# Patient Record
Sex: Female | Born: 1970 | Hispanic: Yes | Marital: Married | Smoking: Former smoker
Health system: Southern US, Community
[De-identification: ages and names within clinical notes are randomized; demographics above are authoritative.]

## PROBLEM LIST (undated history)

## (undated) DIAGNOSIS — E785 Hyperlipidemia, unspecified: Secondary | ICD-10-CM

## (undated) HISTORY — PX: TUBAL LIGATION: SHX77

## (undated) HISTORY — PX: APPENDECTOMY: SHX54

## (undated) HISTORY — DX: Hyperlipidemia, unspecified: E78.5

---

## 2019-01-29 ENCOUNTER — Ambulatory Visit (INDEPENDENT_AMBULATORY_CARE_PROVIDER_SITE_OTHER): Payer: BLUE CROSS/BLUE SHIELD | Admitting: Family Medicine

## 2019-01-29 ENCOUNTER — Encounter: Payer: Self-pay | Admitting: Family Medicine

## 2019-01-29 VITALS — BP 138/70 | HR 60 | Temp 98.7°F | Resp 16 | Ht 61.5 in | Wt 137.0 lb

## 2019-01-29 DIAGNOSIS — Z124 Encounter for screening for malignant neoplasm of cervix: Secondary | ICD-10-CM

## 2019-01-29 DIAGNOSIS — Z1239 Encounter for other screening for malignant neoplasm of breast: Secondary | ICD-10-CM | POA: Diagnosis not present

## 2019-01-29 DIAGNOSIS — Z0001 Encounter for general adult medical examination with abnormal findings: Secondary | ICD-10-CM | POA: Diagnosis not present

## 2019-01-29 DIAGNOSIS — R319 Hematuria, unspecified: Secondary | ICD-10-CM

## 2019-01-29 DIAGNOSIS — Z01419 Encounter for gynecological examination (general) (routine) without abnormal findings: Secondary | ICD-10-CM | POA: Diagnosis not present

## 2019-01-29 DIAGNOSIS — Z131 Encounter for screening for diabetes mellitus: Secondary | ICD-10-CM

## 2019-01-29 DIAGNOSIS — F17200 Nicotine dependence, unspecified, uncomplicated: Secondary | ICD-10-CM

## 2019-01-29 DIAGNOSIS — R300 Dysuria: Secondary | ICD-10-CM

## 2019-01-29 DIAGNOSIS — Z130 Encounter for screening for diseases of the blood and blood-forming organs and certain disorders involving the immune mechanism: Secondary | ICD-10-CM | POA: Diagnosis not present

## 2019-01-29 DIAGNOSIS — R87610 Atypical squamous cells of undetermined significance on cytologic smear of cervix (ASC-US): Secondary | ICD-10-CM

## 2019-01-29 DIAGNOSIS — E010 Iodine-deficiency related diffuse (endemic) goiter: Secondary | ICD-10-CM

## 2019-01-29 DIAGNOSIS — E663 Overweight: Secondary | ICD-10-CM

## 2019-01-29 DIAGNOSIS — Z114 Encounter for screening for human immunodeficiency virus [HIV]: Secondary | ICD-10-CM

## 2019-01-29 DIAGNOSIS — Z7689 Persons encountering health services in other specified circumstances: Secondary | ICD-10-CM

## 2019-01-29 DIAGNOSIS — Z13220 Encounter for screening for lipoid disorders: Secondary | ICD-10-CM

## 2019-01-29 LAB — URINALYSIS, ROUTINE W REFLEX MICROSCOPIC
BILIRUBIN URINE: NEGATIVE
Bacteria, UA: NONE SEEN /HPF
Glucose, UA: NEGATIVE
Ketones, ur: NEGATIVE
Leukocytes,Ua: NEGATIVE
NITRITE: NEGATIVE
Protein, ur: NEGATIVE
SPECIFIC GRAVITY, URINE: 1.025 (ref 1.001–1.03)
WBC, UA: NONE SEEN /HPF (ref 0–5)
pH: 7 (ref 5.0–8.0)

## 2019-01-29 LAB — MICROSCOPIC MESSAGE

## 2019-01-29 NOTE — Progress Notes (Deleted)
Patient ID: Whitney Torres, female    DOB: Dec 06, 1970, 48 y.o.   MRN: [id number]  PCP: Danelle Berry, PA-C  Chief Complaint  Patient presents with  . Annual Exam    Needs pap smear    Subjective:   Whitney Torres is a 48 y.o. female, presents to clinic for annual exam/well woman, is due for PAP and is new to establish care.  About 1 year ago she moved to the [place] from [place], where she lives with her husband.  She has many family members here in [place].    She is G2P2, still having periods monthly, the last 5 days long, have become slightly more bothersome over the past few years, but she does not have heavy blood flow or severe cramps.  LMP was 5 days ago.   She has 2 children, her older child is 34 she had difficulty with his pregnancy with preeclampsia, pregnancy-induced hypertension, she had a C-section 1999, her second child had an uneventful pregnancy and had a repeat C-section in 2012, she is also had appendectomy and tubal ligation. She is sexually active with her husband, no history of STDs.  She reports some urinary frequency intermittent pain or urgency.  She denies any abdominal pain, flank pain, hematuria.  She believes her last Pap was 5 to 6 years ago?  Would like to do today, although she is visibly upset appearing when we discuss it.    She reports a past medical history of elevated cholesterol, but she denies being on any medications for this.  She is a current smoker only smokes a few cigarettes a day and has done this for about 10 years, she has no desire to quit.  Family hx pertinent for  All other medical, family, social history updated per chart   There are no active problems to display for this patient.   Current Meds  Medication Sig  . aspirin EC 81 MG tablet Take 81 mg by mouth at bedtime.     Review of Systems     Objective:    Vitals:   01/29/19 1452  BP: 138/70  Pulse: 60  Resp: 16  Temp: 98.7 F (37.1  C)  TempSrc: Oral  SpO2: 98%  Weight: 137 lb (62.1 kg)  Height: 5' 1.5" (1.562 m)      Physical Exam        Assessment & Plan:   Problem List Items Addressed This Visit    None    Visit Diagnoses    Dysuria    -  Primary   Relevant Orders   Urinalysis, Routine w reflex microscopic (Completed)   Encounter for cervical Pap smear with pelvic exam       Relevant Orders   CBC with Differential/Platelet   COMPLETE METABOLIC PANEL WITH GFR   Lipid panel   TSH   Hemoglobin A1c   PAP,TP IMGw/HPV RNA,rflx HPVTYPE16,18/45 (Completed)   Urinalysis, Routine w reflex microscopic (Completed)   Screening for malignant neoplasm of bladder       Relevant Orders   CBC with Differential/Platelet   COMPLETE METABOLIC PANEL WITH GFR   Lipid panel   TSH   Hemoglobin A1c   PAP,TP IMGw/HPV RNA,rflx HPVTYPE16,18/45 (Completed)   Urinalysis, Routine w reflex microscopic (Completed)   Screening for malignant neoplasm of breast       Relevant Orders   CBC with Differential/Platelet   COMPLETE METABOLIC PANEL WITH GFR   Lipid panel   TSH  Hemoglobin A1c   PAP,TP IMGw/HPV RNA,rflx HPVTYPE16,18/45 (Completed)   Urinalysis, Routine w reflex microscopic (Completed)   Encounter for general adult medical examination with abnormal findings       Visit for screening mammogram       Screening for deficiency anemia       Screening for lipoid disorders       Well woman exam with routine gynecological exam       Cervical cancer screening       Overweight (BMI 25.0-29.9)       Relevant Orders   COMPLETE METABOLIC PANEL WITH GFR   Lipid panel   TSH   Hemoglobin A1c   Hematuria, unspecified type              Danelle Berry, PA-C 01/31/19 5:38 PM

## 2019-01-29 NOTE — Patient Instructions (Signed)
Health Maintenance, Female Adopting a healthy lifestyle and getting preventive care can go a long way to promote health and wellness. Talk with your health care provider about what schedule of regular examinations is right for you. This is a good chance for you to check in with your provider about disease prevention and staying healthy. In between checkups, there are plenty of things you can do on your own. Experts have done a lot of research about which lifestyle changes and preventive measures are most likely to keep you healthy. Ask your health care provider for more information. Weight and diet Eat a healthy diet  Be sure to include plenty of vegetables, fruits, low-fat dairy products, and lean protein.  Do not eat a lot of foods high in solid fats, added sugars, or salt.  Get regular exercise. This is one of the most important things you can do for your health. ? Most adults should exercise for at least 150 minutes each week. The exercise should increase your heart rate and make you sweat (moderate-intensity exercise). ? Most adults should also do strengthening exercises at least twice a week. This is in addition to the moderate-intensity exercise. Maintain a healthy weight  Body mass index (BMI) is a measurement that can be used to identify possible weight problems. It estimates body fat based on height and weight. Your health care provider can help determine your BMI and help you achieve or maintain a healthy weight.  For females 5 years of age and older: ? A BMI below 18.5 is considered underweight. ? A BMI of 18.5 to 24.9 is normal. ? A BMI of 25 to 29.9 is considered overweight. ? A BMI of 30 and above is considered obese. Watch levels of cholesterol and blood lipids  You should start having your blood tested for lipids and cholesterol at 47 years of age, then have this test every 5 years.  You may need to have your cholesterol levels checked more often if: ? Your lipid or  cholesterol levels are high. ? You are older than 47 years of age. ? You are at high risk for heart disease. Cancer screening Lung Cancer  Lung cancer screening is recommended for adults 48-79 years old who are at high risk for lung cancer because of a history of smoking.  A yearly low-dose CT scan of the lungs is recommended for people who: ? Currently smoke. ? Have quit within the past 15 years. ? Have at least a 30-pack-year history of smoking. A pack year is smoking an average of one pack of cigarettes a day for 1 year.  Yearly screening should continue until it has been 15 years since you quit.  Yearly screening should stop if you develop a health problem that would prevent you from having lung cancer treatment. Breast Cancer  Practice breast self-awareness. This means understanding how your breasts normally appear and feel.  It also means doing regular breast self-exams. Let your health care provider know about any changes, no matter how small.  If you are in your 20s or 30s, you should have a clinical breast exam (CBE) by a health care provider every 1-3 years as part of a regular health exam.  If you are 22 or older, have a CBE every year. Also consider having a breast X-ray (mammogram) every year.  If you have a family history of breast cancer, talk to your health care provider about genetic screening.  If you are at high risk for breast cancer, talk  to your health care provider about having an MRI and a mammogram every year.  Breast cancer gene (BRCA) assessment is recommended for women who have family members with BRCA-related cancers. BRCA-related cancers include: ? Breast. ? Ovarian. ? Tubal. ? Peritoneal cancers.  Results of the assessment will determine the need for genetic counseling and BRCA1 and BRCA2 testing. Cervical Cancer Your health care provider may recommend that you be screened regularly for cancer of the pelvic organs (ovaries, uterus, and vagina).  This screening involves a pelvic examination, including checking for microscopic changes to the surface of your cervix (Pap test). You may be encouraged to have this screening done every 3 years, beginning at age 21.  For women ages 30-65, health care providers may recommend pelvic exams and Pap testing every 3 years, or they may recommend the Pap and pelvic exam, combined with testing for human papilloma virus (HPV), every 5 years. Some types of HPV increase your risk of cervical cancer. Testing for HPV may also be done on women of any age with unclear Pap test results.  Other health care providers may not recommend any screening for nonpregnant women who are considered low risk for pelvic cancer and who do not have symptoms. Ask your health care provider if a screening pelvic exam is right for you.  If you have had past treatment for cervical cancer or a condition that could lead to cancer, you need Pap tests and screening for cancer for at least 20 years after your treatment. If Pap tests have been discontinued, your risk factors (such as having a new sexual partner) need to be reassessed to determine if screening should resume. Some women have medical problems that increase the chance of getting cervical cancer. In these cases, your health care provider may recommend more frequent screening and Pap tests. Colorectal Cancer  This type of cancer can be detected and often prevented.  Routine colorectal cancer screening usually begins at 47 years of age and continues through 47 years of age.  Your health care provider may recommend screening at an earlier age if you have risk factors for colon cancer.  Your health care provider may also recommend using home test kits to check for hidden blood in the stool.  A small camera at the end of a tube can be used to examine your colon directly (sigmoidoscopy or colonoscopy). This is done to check for the earliest forms of colorectal cancer.  Routine  screening usually begins at age 50.  Direct examination of the colon should be repeated every 5-10 years through 47 years of age. However, you may need to be screened more often if early forms of precancerous polyps or small growths are found. Skin Cancer  Check your skin from head to toe regularly.  Tell your health care provider about any new moles or changes in moles, especially if there is a change in a mole's shape or color.  Also tell your health care provider if you have a mole that is larger than the size of a pencil eraser.  Always use sunscreen. Apply sunscreen liberally and repeatedly throughout the day.  Protect yourself by wearing long sleeves, pants, a wide-brimmed hat, and sunglasses whenever you are outside. Heart disease, diabetes, and high blood pressure  High blood pressure causes heart disease and increases the risk of stroke. High blood pressure is more likely to develop in: ? People who have blood pressure in the high end of the normal range (130-139/85-89 mm Hg). ? People   who are overweight or obese. ? People who are African American.  If you are 84-22 years of age, have your blood pressure checked every 3-5 years. If you are 67 years of age or older, have your blood pressure checked every year. You should have your blood pressure measured twice-once when you are at a hospital or clinic, and once when you are not at a hospital or clinic. Record the average of the two measurements. To check your blood pressure when you are not at a hospital or clinic, you can use: ? An automated blood pressure machine at a pharmacy. ? A home blood pressure monitor.  If you are between 52 years and 3 years old, ask your health care provider if you should take aspirin to prevent strokes.  Have regular diabetes screenings. This involves taking a blood sample to check your fasting blood sugar level. ? If you are at a normal weight and have a low risk for diabetes, have this test once  every three years after 47 years of age. ? If you are overweight and have a high risk for diabetes, consider being tested at a younger age or more often. Preventing infection Hepatitis B  If you have a higher risk for hepatitis B, you should be screened for this virus. You are considered at high risk for hepatitis B if: ? You were born in a country where hepatitis B is common. Ask your health care provider which countries are considered high risk. ? Your parents were born in a high-risk country, and you have not been immunized against hepatitis B (hepatitis B vaccine). ? You have HIV or AIDS. ? You use needles to inject street drugs. ? You live with someone who has hepatitis B. ? You have had sex with someone who has hepatitis B. ? You get hemodialysis treatment. ? You take certain medicines for conditions, including cancer, organ transplantation, and autoimmune conditions. Hepatitis C  Blood testing is recommended for: ? Everyone born from [birth year] through 1964. ? Anyone with known risk factors for hepatitis C. Sexually transmitted infections (STIs)  You should be screened for sexually transmitted infections (STIs) including gonorrhea and chlamydia if: ? You are sexually active and are younger than 47 years of age. ? You are older than 47 years of age and your health care provider tells you that you are at risk for this type of infection. ? Your sexual activity has changed since you were last screened and you are at an increased risk for chlamydia or gonorrhea. Ask your health care provider if you are at risk.  If you do not have HIV, but are at risk, it may be recommended that you take a prescription medicine daily to prevent HIV infection. This is called pre-exposure prophylaxis (PrEP). You are considered at risk if: ? You are sexually active and do not regularly use condoms or know the HIV status of your partner(s). ? You take drugs by injection. ? You are sexually active with a partner  who has HIV. Talk with your health care provider about whether you are at high risk of being infected with HIV. If you choose to begin PrEP, you should first be tested for HIV. You should then be tested every 3 months for as long as you are taking PrEP. Pregnancy  If you are premenopausal and you may become pregnant, ask your health care provider about preconception counseling.  If you may become pregnant, take 400 to 800 micrograms (mcg) of folic acid every  day.  If you want to prevent pregnancy, talk to your health care provider about birth control (contraception). Osteoporosis and menopause  Osteoporosis is a disease in which the bones lose minerals and strength with aging. This can result in serious bone fractures. Your risk for osteoporosis can be identified using a bone density scan.  If you are 15 years of age or older, or if you are at risk for osteoporosis and fractures, ask your health care provider if you should be screened.  Ask your health care provider whether you should take a calcium or vitamin D supplement to lower your risk for osteoporosis.  Menopause may have certain physical symptoms and risks.  Hormone replacement therapy may reduce some of these symptoms and risks. Talk to your health care provider about whether hormone replacement therapy is right for you. Follow these instructions at home:  Schedule regular health, dental, and eye exams.  Stay current with your immunizations.  Do not use any tobacco products including cigarettes, chewing tobacco, or electronic cigarettes.  If you are pregnant, do not drink alcohol.  If you are breastfeeding, limit how much and how often you drink alcohol.  Limit alcohol intake to no more than 1 drink per day for nonpregnant women. One drink equals 12 ounces of beer, 5 ounces of wine, or 1 ounces of hard liquor.  Do not use street drugs.  Do not share needles.  Ask your health care provider for help if you need support  or information about quitting drugs.  Tell your health care provider if you often feel depressed.  Tell your health care provider if you have ever been abused or do not feel safe at home. This information is not intended to replace advice given to you by your health care provider. Make sure you discuss any questions you have with your health care provider. Document Released: 05/29/2011 Document Revised: 04/20/2016 Document Reviewed: 08/17/2015 Elsevier Interactive Patient Education  2019 [place] and Cholesterol Restricted Eating Plan Getting too much fat and cholesterol in your diet may cause health problems. Choosing the right foods helps keep your fat and cholesterol at normal levels. This can keep you from getting certain diseases. Your doctor may recommend an eating plan that includes:  Total fat: ______% or less of total calories a day.  Saturated fat: ______% or less of total calories a day.  Cholesterol: less than _________mg a day.  Fiber: ______g a day. What are tips for following this plan? Meal planning  At meals, divide your plate into four equal parts: ? Fill one-half of your plate with vegetables and green salads. ? Fill one-fourth of your plate with whole grains. ? Fill one-fourth of your plate with low-fat (lean) protein foods.  Eat fish that is high in omega-3 fats at least two times a week. This includes mackerel, tuna, sardines, and salmon.  Eat foods that are high in fiber, such as whole grains, beans, apples, broccoli, carrots, peas, and barley. General tips   Work with your doctor to lose weight if you need to.  Avoid: ? Foods with added sugar. ? Fried foods. ? Foods with partially hydrogenated oils.  Limit alcohol intake to no more than 1 drink a day for nonpregnant women and 2 drinks a day for men. One drink equals 12 oz of beer, 5 oz of wine, or 1 oz of hard liquor. Reading food labels  Check food labels for: ? Trans  fats. ? Partially hydrogenated oils. ? Saturated  fat (g) in each serving. ? Cholesterol (mg) in each serving. ? Fiber (g) in each serving.  Choose foods with healthy fats, such as: ? Monounsaturated fats. ? Polyunsaturated fats. ? Omega-3 fats.  Choose grain products that have whole grains. Look for the word "whole" as the first word in the ingredient list. Cooking  Cook foods using low-fat methods. These include baking, boiling, grilling, and broiling.  Eat more home-cooked foods. Eat at restaurants and buffets less often.  Avoid cooking using saturated fats, such as butter, cream, palm oil, palm kernel oil, and coconut oil. Recommended foods  Fruits  All fresh, canned (in natural juice), or frozen fruits. Vegetables  Fresh or frozen vegetables (raw, steamed, roasted, or grilled). Green salads. Grains  Whole grains, such as whole wheat or whole grain breads, crackers, cereals, and pasta. Unsweetened oatmeal, bulgur, barley, quinoa, or brown rice. Corn or whole wheat flour tortillas. Meats and other protein foods  Ground beef (85% or leaner), grass-fed beef, or beef trimmed of fat. Skinless chicken or [place]. Ground chicken or [place]. Pork trimmed of fat. All fish and seafood. Egg whites. Dried beans, peas, or lentils. Unsalted nuts or seeds. Unsalted canned beans. Nut butters without added sugar or oil. Dairy  Low-fat or nonfat dairy products, such as skim or 1% milk, 2% or reduced-fat cheeses, low-fat and fat-free ricotta or cottage cheese, or plain low-fat and nonfat yogurt. Fats and oils  Tub margarine without trans fats. Light or reduced-fat mayonnaise and salad dressings. Avocado. Olive, canola, sesame, or safflower oils. The items listed above may not be a complete list of foods and beverages you can eat. Contact a dietitian for more information. Foods to avoid Fruits  Canned fruit in heavy syrup. Fruit in cream or butter sauce. Fried fruit. Vegetables  Vegetables  cooked in cheese, cream, or butter sauce. Fried vegetables. Grains  White bread. White pasta. White rice. Cornbread. Bagels, pastries, and croissants. Crackers and snack foods that contain trans fat and hydrogenated oils. Meats and other protein foods  Fatty cuts of meat. Ribs, chicken wings, bacon, sausage, bologna, salami, chitterlings, fatback, hot dogs, bratwurst, and packaged lunch meats. Liver and organ meats. Whole eggs and egg yolks. Chicken and [place] with skin. Fried meat. Dairy  Whole or 2% milk, cream, half-and-half, and cream cheese. Whole milk cheeses. Whole-fat or sweetened yogurt. Full-fat cheeses. Nondairy creamers and whipped toppings. Processed cheese, cheese spreads, and cheese curds. Beverages  Alcohol. Sugar-sweetened drinks such as sodas, lemonade, and fruit drinks. Fats and oils  Butter, stick margarine, lard, shortening, ghee, or bacon fat. Coconut, palm kernel, and palm oils. Sweets and desserts  Corn syrup, sugars, honey, and molasses. Candy. Jam and jelly. Syrup. Sweetened cereals. Cookies, pies, cakes, donuts, muffins, and ice cream. The items listed above may not be a complete list of foods and beverages you should avoid. Contact a dietitian for more information. Summary  Choosing the right foods helps keep your fat and cholesterol at normal levels. This can keep you from getting certain diseases.  At meals, fill one-half of your plate with vegetables and green salads.  Eat high-fiber foods, like whole grains, beans, apples, carrots, peas, and barley.  Limit added sugar, saturated fats, alcohol, and fried foods. This information is not intended to replace advice given to you by your health care provider. Make sure you discuss any questions you have with your health care provider. Document Released: 05/14/2012 Document Revised: 07/17/2018 Document Reviewed: 07/31/2017 Elsevier Interactive Patient Education  2019 [employer].  American Heart Association  (AHA) Exercise Recommendation  Being physically active is important to prevent heart disease and stroke, the nation's No. 1and No. 5killers. To improve overall cardiovascular health, we suggest at least 150 minutes per week of moderate exercise or 75 minutes per week of vigorous exercise (or a combination of moderate and vigorous activity). Thirty minutes a day, five times a week is an easy goal to remember. You will also experience benefits even if you divide your time into two or three segments of 10 to 15 minutes per day.  For people who would benefit from lowering their blood pressure or cholesterol, we recommend 40 minutes of aerobic exercise of moderate to vigorous intensity three to four times a week to lower the risk for heart attack and stroke.  Physical activity is anything that makes you move your body and burn calories.  This includes things like climbing stairs or playing sports. Aerobic exercises benefit your heart, and include walking, jogging, swimming or biking. Strength and stretching exercises are best for overall stamina and flexibility.  The simplest, positive change you can make to effectively improve your heart health is to start walking. It's enjoyable, free, easy, social and great exercise. A walking program is flexible and boasts high success rates because people can stick with it. It's easy for walking to become a regular and satisfying part of life.   For Overall Cardiovascular Health:  At least 30 minutes of moderate-intensity aerobic activity at least 5 days per week for a total of 150  OR   At least 25 minutes of vigorous aerobic activity at least 3 days per week for a total of 75 minutes; or a combination of moderate- and vigorous-intensity aerobic activity  AND   Moderate- to high-intensity muscle-strengthening activity at least 2 days per week for additional health benefits.  For Lowering Blood Pressure and Cholesterol  An average 40 minutes of moderate-  to vigorous-intensity aerobic activity 3 or 4 times per week  What if I can't make it to the time goal? Something is always better than nothing! And everyone has to start somewhere. Even if you've been sedentary for years, today is the day you can begin to make healthy changes in your life. If you don't think you'll make it for 30 or 40 minutes, set a reachable goal for today. You can work up toward your overall goal by increasing your time as you get stronger. Don't let all-or-nothing thinking rob you of doing what you can every day.  Source:http://www.heart.org

## 2019-01-30 LAB — PAP, TP IMAGING W/ HPV RNA, RFLX HPV TYPE 16,18/45: HPV DNA High Risk: NOT DETECTED

## 2019-01-31 ENCOUNTER — Encounter: Payer: Self-pay | Admitting: Family Medicine

## 2019-01-31 DIAGNOSIS — E010 Iodine-deficiency related diffuse (endemic) goiter: Secondary | ICD-10-CM | POA: Insufficient documentation

## 2019-01-31 DIAGNOSIS — R87610 Atypical squamous cells of undetermined significance on cytologic smear of cervix (ASC-US): Secondary | ICD-10-CM | POA: Insufficient documentation

## 2019-01-31 DIAGNOSIS — F17200 Nicotine dependence, unspecified, uncomplicated: Secondary | ICD-10-CM | POA: Insufficient documentation

## 2019-01-31 DIAGNOSIS — E663 Overweight: Secondary | ICD-10-CM | POA: Insufficient documentation

## 2019-01-31 DIAGNOSIS — R319 Hematuria, unspecified: Secondary | ICD-10-CM | POA: Insufficient documentation

## 2019-01-31 NOTE — Progress Notes (Signed)
Patient: Whitney Torres, Female    DOB: [birth year], 48 y.o.   MRN: [id number] Visit Date: 01/31/2019  Today's Provider: Danelle Berry, PA-C   Chief Complaint  Patient presents with  . Annual Exam    Needs pap smear   Subjective:    Annual physical exam Whitney Torres is a 48 y.o. female who presents today for health maintenance and complete physical. She feels well. She reports exercising not much at all. She reports she is sleeping well. -----------------------------------------------------------------   Whitney Torres is a 48 y.o. female, presents to clinic for annual exam/well woman, is due for PAP and is new to establish care.  About 1 year ago she moved to the [place] from [place], where she lives with her husband.  She has many family members here in [place].    She is G2P2, still having periods monthly, the last 5 days long, have become slightly more bothersome over the past few years, but she does not have heavy blood flow or severe cramps.  LMP was 5 days ago.   She has 2 children, her older child is 55 she had difficulty with his pregnancy with preeclampsia, pregnancy-induced hypertension, Bell's palsy - with persistent left sided facial paralysis - first pregnancy delivered prematurely via C-section 1999. Her second child had an uneventful pregnancy and had a repeat C-section in 2012. Other surgical hx of appendectomy and tubal ligation. She is sexually active with her husband, no history of STDs.  She reports some urinary frequency intermittent pain or urgency.  She denies any abdominal pain, flank pain, hematuria.  She believes her last Pap was 5 to 6 years ago?  Would like to do today, although she is visibly upset appearing when we discuss it.    She reports a past medical history of elevated cholesterol, but she denies being on any medications for this.  She takes ASA for her "circulation" sometimes she has her hands fall asleep at  night and they tingle, she was advised to take ASA for this.   She is a current smoker only smokes a few cigarettes a day and has done this for about 10 years, she has no desire to quit.  All other medical, family, social history updated per chart  Did have PCP in [place], she does not have any records with her. In moving to the [place] she does have her extensive immunizations that were required and she will bring the records from home so we can get a copy of that.   Review of Systems  Constitutional: Negative.  Negative for activity change and unexpected weight change.  HENT: Negative.   Eyes: Negative.   Respiratory: Negative.  Negative for cough, shortness of breath and wheezing.   Cardiovascular: Negative.  Negative for chest pain, palpitations and leg swelling.  Gastrointestinal: Negative.  Negative for abdominal distention, abdominal pain and constipation.  Endocrine: Negative.   Genitourinary: Negative.  Negative for dyspareunia.  Musculoskeletal: Negative.  Negative for arthralgias and back pain.  Skin: Negative.   Allergic/Immunologic: Negative.   Neurological: Negative.  Negative for dizziness, weakness and light-headedness.  Hematological: Negative.  Negative for adenopathy.  Psychiatric/Behavioral: Negative.  Negative for sleep disturbance.  All other systems reviewed and are negative.   Social History      She  reports that she has been smoking cigarettes. She has a 2.50 pack-year smoking history. She has never used smokeless tobacco. She reports current alcohol use of about 2.0 standard drinks of alcohol  per week. She reports that she does not use drugs.       Social History   Socioeconomic History  . Marital status: Married    Spouse name: Not on file  . Number of children: 2  . Years of education: Not on file  . Highest education level: Not on file  Occupational History  . Not on file  Social Needs  . Financial resource strain: Not on file  . Food  insecurity:    Worry: Not on file    Inability: Not on file  . Transportation needs:    Medical: Not on file    Non-medical: Not on file  Tobacco Use  . Smoking status: Current Some Day Smoker    Packs/day: 0.25    Years: 10.00    Pack years: 2.50    Types: Cigarettes  . Smokeless tobacco: Never Used  Substance and Sexual Activity  . Alcohol use: Yes    Alcohol/week: 2.0 standard drinks    Types: 2 Cans of beer per week    Frequency: Never  . Drug use: Never  . Sexual activity: Yes  Lifestyle  . Physical activity:    Days per week: Not on file    Minutes per session: Not on file  . Stress: Not on file  Relationships  . Social connections:    Talks on phone: Not on file    Gets together: Not on file    Attends religious service: Not on file    Active member of club or organization: Not on file    Attends meetings of clubs or organizations: Not on file    Relationship status: Not on file  Other Topics Concern  . Not on file  Social History Narrative  . Not on file    Past Medical History:  Diagnosis Date  . Hyperlipidemia      There are no active problems to display for this patient.   Past Surgical History:  Procedure Laterality Date  . APPENDECTOMY    . CESAREAN SECTION     1999, 2012  . TUBAL LIGATION      Family History        Family Status  Relation Name Status  . Mother  Alive  . Father  Deceased  . Mat Uncle  (Not Specified)  . MGM  (Not Specified)        Her family history includes Cancer in her maternal uncle; Cancer (age of onset: 29) in her maternal grandmother; Diabetes in her maternal grandmother; Hypertension in her mother; Liver disease in her father.      Not on File   Current Outpatient Medications:  .  aspirin EC 81 MG tablet, Take 81 mg by mouth at bedtime., Disp: , Rfl:    Patient Care Team: Danelle Berry, PA-C as PCP - General (Family Medicine)      Objective:   Vitals: BP 138/70   Pulse 60   Temp 98.7 F (37.1 C)  (Oral)   Resp 16   Ht 5' 1.5" (1.562 m)   Wt 137 lb (62.1 kg)   LMP 01/24/2019 Comment: regular still, more mood swings, cramps, monthly 5 days light  SpO2 98%   BMI 25.47 kg/m    Physical Exam Vitals signs and nursing note reviewed. Exam conducted with a chaperone present.  Constitutional:      General: She is not in acute distress.    Appearance: Normal appearance. She is well-developed. She is not ill-appearing, toxic-appearing or diaphoretic.  HENT:     Head: Normocephalic and atraumatic.     Right Ear: Tympanic membrane, ear canal and external ear normal. There is no impacted cerumen.     Left Ear: Tympanic membrane, ear canal and external ear normal. There is no impacted cerumen.     Nose: Nose normal. No congestion or rhinorrhea.     Mouth/Throat:     Mouth: Mucous membranes are moist.     Pharynx: Oropharynx is clear. Uvula midline.  Eyes:     General: Lids are normal. No scleral icterus.       Right eye: No discharge.        Left eye: No discharge.     Conjunctiva/sclera: Conjunctivae normal.     Right eye: Right conjunctiva is not injected.     Left eye: Left conjunctiva is not injected.     Pupils: Pupils are equal, round, and reactive to light.  Neck:     Musculoskeletal: Normal range of motion and neck supple. No muscular tenderness.     Thyroid: Thyromegaly present. No thyroid tenderness.     Trachea: Trachea and phonation normal. No tracheal deviation.  Cardiovascular:     Rate and Rhythm: Normal rate and regular rhythm.  No extrasystoles are present.    Chest Wall: PMI is not displaced.     Pulses: Normal pulses.          Radial pulses are 2+ on the right side and 2+ on the left side.       Posterior tibial pulses are 2+ on the right side and 2+ on the left side.     Heart sounds: Normal heart sounds. Heart sounds not distant. No murmur. No friction rub. No gallop.   Pulmonary:     Effort: Pulmonary effort is normal. No respiratory distress.     Breath  sounds: Normal breath sounds. No stridor. No wheezing, rhonchi or rales.  Chest:     Chest wall: No tenderness.  Abdominal:     General: Bowel sounds are normal. There is no distension.     Palpations: Abdomen is soft.     Tenderness: There is no abdominal tenderness. There is no right CVA tenderness, left CVA tenderness, guarding or rebound.  Genitourinary:    General: Normal vulva.     Pubic Area: No rash or pubic lice.      Labia:        Right: No rash, tenderness, lesion or injury.        Left: No rash, tenderness, lesion or injury.      Vagina: No signs of injury and foreign body. Vaginal discharge (brown) present. No erythema, tenderness, bleeding or lesions.     Cervix: Discharge (brown) present. No cervical motion tenderness, friability, lesion, erythema, cervical bleeding or eversion.     Uterus: Normal.      Adnexa: Right adnexa normal and left adnexa normal.     Comments: PAP obtained, Sandy chaperone and assist Musculoskeletal: Normal range of motion.        General: No deformity.     Right lower leg: No edema.     Left lower leg: No edema.  Lymphadenopathy:     Cervical: No cervical adenopathy.  Skin:    General: Skin is warm and dry.     Capillary Refill: Capillary refill takes less than 2 seconds.     Coloration: Skin is not jaundiced or pale.     Findings: No rash.  Neurological:     Mental  Status: She is alert and oriented to person, place, and time.     Cranial Nerves: Facial asymmetry present.     Motor: No weakness, tremor or abnormal muscle tone.     Coordination: Coordination normal.     Gait: Gait is intact. Gait normal.     Comments: Left-sided facial nerve palsy  Psychiatric:        Speech: Speech normal.        Behavior: Behavior normal.        Depression Screen PHQ 2/9 Scores 01/29/2019  PHQ - 2 Score 0     Office Visit from 01/29/2019 in [facility]  AUDIT-C Score  1       Assessment & Plan:     Routine Health  Maintenance and Physical Exam  Discussed health benefits of physical activity, and encouraged her to engage in regular exercise appropriate for her age and condition.  Hand out given for cholesterol and AHA exercise recommendations  Immunizations - obtain records - states all done to enter [place]  Health Maintenance  Topic Date Due  . HIV Screening  09/05/1987  . TETANUS/TDAP  09/05/1991  . PAP SMEAR-Modifier  01/29/2020  . INFLUENZA VACCINE  Completed  Tdap - get immunization records - pt to bring in HIV screen ordered PAP done  Flu done    ICD-10-CM   1. Encounter for general adult medical examination with abnormal findings Z00.01 CBC with Differential/Platelet    COMPLETE METABOLIC PANEL WITH GFR    Lipid panel    TSH    Hemoglobin A1c   forms for husbands employer completed  2. Encounter for cervical Pap smear with pelvic exam Z01.419 PAP,TP IMGw/HPV RNA,rflx HPVTYPE16,18/45  3. Screening for malignant neoplasm of breast Z12.39 MM Digital Screening  4. Screening for deficiency anemia Z13.0 CBC with Differential/Platelet  5. Screening for lipoid disorders Z13.220 Lipid panel   hx of?  no meds in the past, return for fasting labs  6. Cervical cancer screening Z12.4 PAP,TP IMGw/HPV RNA,rflx HPVTYPE16,18/45   PAP done with HPV testing, cervix healthy appearing - nulliparous, pt very anxious with exam  7. Overweight (BMI 25.0-29.9) E66.3 COMPLETE METABOLIC PANEL WITH GFR    Lipid panel    TSH    Hemoglobin A1c  8. Dysuria R30.0 Urinalysis, Routine w reflex microscopic    Microscopic Message    Urinalysis, Routine w reflex microscopic    Urine Culture   mild sx, screen UA  9. Hematuria, unspecified type R31.9 Urinalysis, Routine w reflex microscopic    Urine Culture   dip + for blood, otherwise normal, may be from vaginal secretions with recent menses, obtain another UA  10. Encounter to establish care with new doctor Z76.89    Pt to bring back vaccination records, obtain past  med records if able, but [place]  11. Screening for diabetes mellitus Z13.1 COMPLETE METABOLIC PANEL WITH GFR    Hemoglobin A1c   fasting labs, A1C, + family hx  12. 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 Screening for HIV without presence of risk factors Z11.4 HIV Antibody (routine testing w rflx)   may be on paperwork, but adding onto labs in case not done  14. Current smoker F17.200    No desire to quit - advised  on risks, encouraged cessation          Danelle BerryLeisa Ja Ohman, PA-C 01/31/19 6:29 PM  [facility]

## 2019-07-29 ENCOUNTER — Other Ambulatory Visit: Payer: Self-pay | Admitting: Family Medicine

## 2019-07-29 DIAGNOSIS — R319 Hematuria, unspecified: Secondary | ICD-10-CM

## 2019-07-29 DIAGNOSIS — E663 Overweight: Secondary | ICD-10-CM

## 2019-07-29 DIAGNOSIS — E782 Mixed hyperlipidemia: Secondary | ICD-10-CM

## 2019-07-29 DIAGNOSIS — Z130 Encounter for screening for diseases of the blood and blood-forming organs and certain disorders involving the immune mechanism: Secondary | ICD-10-CM

## 2019-07-29 DIAGNOSIS — Z114 Encounter for screening for human immunodeficiency virus [HIV]: Secondary | ICD-10-CM

## 2019-07-29 DIAGNOSIS — Z13220 Encounter for screening for lipoid disorders: Secondary | ICD-10-CM

## 2019-07-29 DIAGNOSIS — F17200 Nicotine dependence, unspecified, uncomplicated: Secondary | ICD-10-CM

## 2019-07-29 DIAGNOSIS — E010 Iodine-deficiency related diffuse (endemic) goiter: Secondary | ICD-10-CM

## 2019-07-29 DIAGNOSIS — Z0000 Encounter for general adult medical examination without abnormal findings: Secondary | ICD-10-CM

## 2019-07-29 DIAGNOSIS — Z13228 Encounter for screening for other metabolic disorders: Secondary | ICD-10-CM

## 2019-07-29 NOTE — Progress Notes (Signed)
Pt is coming to [facility] clinic and keeping me as her PCP, never completed labs from past OV/CPE done at [facility] March 2020.  Past orders cancelled and reordered here.  Pt was in clinic today with her daughter and mother, and says she will come back Friday for fasting labs.  1. Thyromegaly Some pmhx per pt - from [place] - TSH - T4, free  2. Overweight (BMI 25.0-29.9) - Lipid panel - Hemoglobin A1c - TSH  3. Current smoker - CBC with Differential/Platelet  4. Hematuria, unspecified type - Urinalysis, Routine w reflex microscopic  5. General medical exam  - CBC with Differential/Platelet - COMPLETE METABOLIC PANEL WITH GFR - Lipid panel - Hemoglobin A1c   6. Screening for endocrine, nutritional, metabolic and immunity disorder  - CBC with Differential/Platelet - COMPLETE METABOLIC PANEL WITH GFR - Lipid panel - Hemoglobin A1c - TSH  7. Screening for lipid disorders  - Lipid panel  8. Screening for HIV without presence of risk factors - HIV Antibody (routine testing w rflx)

## 2019-08-02 LAB — CBC WITH DIFFERENTIAL/PLATELET
Absolute Monocytes: 526 cells/uL (ref 200–950)
Basophils Absolute: 37 cells/uL (ref 0–200)
Basophils Relative: 0.5 %
Eosinophils Absolute: 365 cells/uL (ref 15–500)
Eosinophils Relative: 5 %
HCT: 41.1 % (ref 35.0–45.0)
Hemoglobin: 13.4 g/dL (ref 11.7–15.5)
Lymphs Abs: 2716 cells/uL (ref 850–3900)
MCH: 31.8 pg (ref 27.0–33.0)
MCHC: 32.6 g/dL (ref 32.0–36.0)
MCV: 97.6 fL (ref 80.0–100.0)
MPV: 10.7 fL (ref 7.5–12.5)
Monocytes Relative: 7.2 %
Neutro Abs: 3657 cells/uL (ref 1500–7800)
Neutrophils Relative %: 50.1 %
Platelets: 265 10*3/uL (ref 140–400)
RBC: 4.21 10*6/uL (ref 3.80–5.10)
RDW: 12.3 % (ref 11.0–15.0)
Total Lymphocyte: 37.2 %
WBC: 7.3 10*3/uL (ref 3.8–10.8)

## 2019-08-02 LAB — LIPID PANEL
Cholesterol: 259 mg/dL — ABNORMAL HIGH (ref <200)
HDL: 45 mg/dL — ABNORMAL LOW (ref >=50)
LDL Cholesterol (Calc): 176 mg/dL (calc) — ABNORMAL HIGH
Non-HDL Cholesterol (Calc): 214 mg/dL (calc) — ABNORMAL HIGH (ref <130)
Total CHOL/HDL Ratio: 5.8 (calc) — ABNORMAL HIGH (ref <5.0)
Triglycerides: 222 mg/dL — ABNORMAL HIGH (ref <150)

## 2019-08-02 LAB — URINALYSIS, ROUTINE W REFLEX MICROSCOPIC
Bilirubin Urine: NEGATIVE
Glucose, UA: NEGATIVE
Hgb urine dipstick: NEGATIVE
Ketones, ur: NEGATIVE
Leukocytes,Ua: NEGATIVE
Nitrite: NEGATIVE
Protein, ur: NEGATIVE
Specific Gravity, Urine: 1.021 (ref 1.001–1.03)
pH: 7 (ref 5.0–8.0)

## 2019-08-02 LAB — COMPLETE METABOLIC PANEL WITH GFR
AG Ratio: 1.8 (calc) (ref 1.0–2.5)
ALT: 16 U/L (ref 6–29)
AST: 20 U/L (ref 10–35)
Albumin: 4.1 g/dL (ref 3.6–5.1)
Alkaline phosphatase (APISO): 52 U/L (ref 31–125)
BUN: 14 mg/dL (ref 7–25)
CO2: 27 mmol/L (ref 20–32)
Calcium: 9.3 mg/dL (ref 8.6–10.2)
Chloride: 106 mmol/L (ref 98–110)
Creat: 0.75 mg/dL (ref 0.50–1.10)
GFR, Est African American: 111 mL/min/{1.73_m2} (ref >=60)
GFR, Est Non African American: 96 mL/min/{1.73_m2} (ref >=60)
Globulin: 2.3 g/dL (calc) (ref 1.9–3.7)
Glucose, Bld: 78 mg/dL (ref 65–99)
Potassium: 4.9 mmol/L (ref 3.5–5.3)
Sodium: 140 mmol/L (ref 135–146)
Total Bilirubin: 0.5 mg/dL (ref 0.2–1.2)
Total Protein: 6.4 g/dL (ref 6.1–8.1)

## 2019-08-02 LAB — HIV ANTIBODY (ROUTINE TESTING W REFLEX): HIV 1&2 Ab, 4th Generation: NONREACTIVE

## 2019-08-02 LAB — HEMOGLOBIN A1C
Hgb A1c MFr Bld: 5.5 % of total Hgb (ref <5.7)
Mean Plasma Glucose: 111 (calc)
eAG (mmol/L): 6.2 (calc)

## 2019-08-02 LAB — TSH: TSH: 1.46 mIU/L

## 2019-08-02 LAB — T4, FREE: Free T4: 0.9 ng/dL (ref 0.8–1.8)

## 2019-08-06 MED ORDER — ATORVASTATIN CALCIUM 20 MG PO TABS: 20.0000 mg | ORAL_TABLET | Freq: Every day | ORAL | 3 refills | Status: DC

## 2019-08-06 NOTE — Addendum Note (Signed)
Addended by: Delsa Grana on: 08/06/2019 04:44 PM   Modules accepted: Orders

## 2020-02-06 ENCOUNTER — Ambulatory Visit: Payer: Self-pay | Attending: Internal Medicine

## 2020-02-06 DIAGNOSIS — Z23 Encounter for immunization: Secondary | ICD-10-CM

## 2020-02-06 NOTE — Progress Notes (Signed)
   Covid-19 Vaccination Clinic  Name:  Whitney Torres    MRN: [id number] DOB: August 20, 1971  02/06/2020  [name] was observed post Covid-19 immunization for 15 minutes without incident. She was provided with Vaccine Information Sheet and instruction to access the V-Safe system.   [name] was instructed to call 911 with any severe reactions post vaccine: Marland Kitchen Difficulty breathing  . Swelling of face and throat  . A fast heartbeat  . A bad rash all over body  . Dizziness and weakness   Immunizations Administered    Name Date Dose VIS Date Route   Pfizer COVID-19 Vaccine 02/06/2020 10:44 AM 0.3 mL 11/07/2019 Intramuscular   Manufacturer: [employer], [employer]   Lot: [id number]   NDC: [address]-5

## 2020-03-01 ENCOUNTER — Ambulatory Visit: Payer: Self-pay

## 2020-03-03 ENCOUNTER — Ambulatory Visit: Payer: Self-pay | Attending: Internal Medicine

## 2020-03-03 DIAGNOSIS — Z23 Encounter for immunization: Secondary | ICD-10-CM

## 2020-03-03 NOTE — Progress Notes (Signed)
   Covid-19 Vaccination Clinic  Name:  Shanisha Lech    MRN: [id number] DOB: May 05, 1971  03/03/2020  Ms. Ocampo was observed post Covid-19 immunization for 15 minutes without incident. She was provided with Vaccine Information Sheet and instruction to access the V-Safe system.   Ms. Lai was instructed to call 911 with any severe reactions post vaccine: Marland Kitchen Difficulty breathing  . Swelling of face and throat  . A fast heartbeat  . A bad rash all over body  . Dizziness and weakness   Immunizations Administered    Name Date Dose VIS Date Route   Pfizer COVID-19 Vaccine 03/03/2020  3:51 PM 0.3 mL 11/07/2019 Intramuscular   Manufacturer: [employer], [employer]   Lot: [id number]   NDC: [address]-7

## 2020-07-01 NOTE — Progress Notes (Signed)
Patient ID: Whitney Torres, female    DOB: 08-30-1971, 49 y.o.   MRN: [id number]  PCP: Danelle Berry, PA-C  Chief Complaint  Patient presents with  . Annual Exam    Subjective:   Whitney Torres is a 49 y.o. female, presents to clinic with CC of the following:  Chief Complaint  Patient presents with  . Annual Exam    HPI:  Patient is a 49 year old female patient of Danelle Berry Was last seen in March 2020 for an annual exam. She had fasting labs done in September 2020 Follows up today for a physical for work with a form to complete for work and obtain labs.  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  She has many family members here in [place] Her daughter, who will be attending [employer], was with her on this visit  Woman's health  She has 2 children, her older child is 51 she had difficulty with his pregnancy with preeclampsia, pregnancy-induced hypertension, Bell's palsy - with persistent left sided facial paralysis - first pregnancy delivered prematurely via C-section 1999. Her second child had an uneventful pregnancy and had a repeat C-section in 2012.   Her last Pap was March 2020 with the following results:  The Pap smear results show that there was no HPV virus which is very good.  The Pap smear cells were abnormal -and so we will need to repeat the Pap in 1 year.   The diagnosis is called atypical squamous cells of undetermined significance - meaning we don't know what the abnormal PAP means so we just recheck it in closer intervals.  I can also refer you to OBGYN if you would like for them to monitor this.  She has not yet had a follow-up Pap and is overdue for that.  Hyperlipidemia Current medication -was on Lipitor 20 mg daily, took for 3 months and then stopped Has been exercising more and trying to lose weight to help.  Lab Results    Component Value Date   CHOL 259 (H) 08/01/2019   HDL 45 (L) 08/01/2019   LDLCALC 176 (H) 08/01/2019   TRIG 222 (H) 08/01/2019   CHOLHDL 5.8 (H) 08/01/2019   Denied myalgias with the medicine  Overweight Wt Readings from Last 3 Encounters:  07/02/20 131 lb 9.6 oz (59.7 kg)  01/29/19 137 lb (62.1 kg)   Weight has decreased some from last visit, down about 6 pounds  Other surgical hx of appendectomy and tubal ligation.  Noted last visit was taking ASA for her "circulation" as sometimes she has her hands fall asleep at night and they tingle, she was advised to take ASA for this in the past. Had stopped taking that in recent past.  She still notes that at times she has some distal upper extremity and hand numbness at nighttime, usually after laying down for a while, and she notes she does often have her head rest on her left upper extremity when she sleeps, and her numbness is often in the left hand.  Do feel this is related.  Denies any distal upper extremity or hand numbness during the day.  Tobacco use-notes 2-3 cigs per day now, was heavier in the past, encouraged to try to completely eliminate tobacco Alcohol use- social, twice a month, beer/wine Exercise - walk and run a bit, 35 minutes 3-4 times a week Diet - trying  to cut down fried foods (has occas reflux with these as well as occasional reflux with spicy foods)  Patient Active Problem List   Diagnosis Date Noted  . Overweight (BMI 25.0-29.9) 01/31/2019  . Hematuria 01/31/2019  . Thyromegaly 01/31/2019  . Current smoker 01/31/2019  . ASCUS of cervix with negative high risk HPV 01/31/2019     No current outpatient medications on file.   No Known Allergies   Past Surgical History:  Procedure Laterality Date  . APPENDECTOMY    . CESAREAN SECTION     1999, 2012  . TUBAL LIGATION       Family History  Problem Relation Age of Onset  . Hypertension Mother   . Liver disease Father   . Cancer Maternal Uncle         stomach  . Diabetes Maternal Grandmother   . Cancer Maternal Grandmother [id number]       colon cancer     Social History   Tobacco Use  . Smoking status: Current Some Day Smoker    Packs/day: 0.25    Years: 10.00    Pack years: 2.50    Types: Cigarettes  . Smokeless tobacco: Never Used  Substance Use Topics  . Alcohol use: Yes    Alcohol/week: 2.0 standard drinks    Types: 2 Cans of beer per week    With staff assistance, above reviewed with the patient today.  ROS: As per HPI, denied any recent chest pains, palpitations, shortness of breath, no increased lower extremity swelling, no persistent abdominal pains, no dark or black stools, no bleeding per rectum, no increased headaches or vision changes, no balance issues, otherwise no specific complaints on a limited and focused system review   No results found for this or any previous visit (from the past 72 hour(s)).   PHQ2/9: Depression screen [facility] 07/02/2020 01/29/2019  Decreased Interest 0 0  Down, Depressed, Hopeless 0 0  PHQ - 2 Score 0 0  Altered sleeping 0 -  Tired, decreased energy 0 -  Change in appetite 0 -  Feeling bad or failure about yourself  0 -  Trouble concentrating 0 -  Moving slowly or fidgety/restless 0 -  Suicidal thoughts 0 -  PHQ-9 Score 0 -  Difficult doing work/chores Not difficult at all -   PHQ-2/9 Result is neg  Fall Risk: Fall Risk  07/02/2020  Falls in the past year? 0  Number falls in past yr: 0  Injury with Fall? 0      Objective:   Vitals:   07/02/20 0930  BP: 118/82  Pulse: 77  Resp: 16  Temp: 98.7 F (37.1 C)  TempSrc: Temporal  SpO2: 100%  Weight: 131 lb 9.6 oz (59.7 kg)  Height: 5\' 2"  (1.575 m)    Body mass index is 24.07 kg/m.  Physical Exam   NAD, masked, pleasant HEENT - [place]/AT, sclera anicteric, PERRL, EOMI, conj - non-inj'ed, TM's and canals clear, pharynx clear Neck - supple, no adenopathy, no TM, carotids 2+ and = without bruits bilat Car - RRR without  m/g/r Pulm- RR and effort normal at rest, CTA without wheeze or rales Abd - soft, NT, ND, BS+,  no masses, no HSM Back - no CVA tenderness Skin- no rash noted on exposed areas, patient denied otherwise Ext - no LE edema, no active joints, distal pulses in the upper extremity were good, hands were warm to the touch, good capillary refill noted of the nails, Neuro/psychiatric - affect was  not flat, appropriate with conversation  Alert and oriented  Grossly non-focal - good strength on testing extremities including good grip strength in the upper extremities, sensation intact to LT in distal extremities, DTRs were 2+ and equal in the patella, Romberg negative, no pronator drift, good finger-to-nose, good balance on 1 foot,  Speech and gait are normal, with good tandem walk  Results for orders placed or performed in visit on 07/29/19  CBC with Differential/Platelet  Result Value Ref Range   WBC 7.3 3.8 - 10.8 Thousand/uL   RBC 4.21 3.80 - 5.10 Million/uL   Hemoglobin 13.4 11.7 - 15.5 g/dL   HCT 16.1 35 - 45 %   MCV 97.6 80.0 - 100.0 fL   MCH 31.8 27.0 - 33.0 pg   MCHC 32.6 32.0 - 36.0 g/dL   RDW 09.6 04.5 - 40.9 %   Platelets 265 140 - 400 Thousand/uL   MPV 10.7 7.5 - 12.5 fL   Neutro Abs 3,657 1,500 - 7,800 cells/uL   Lymphs Abs 2,716 850 - 3,900 cells/uL   Absolute Monocytes 526 200 - 950 cells/uL   Eosinophils Absolute 365 15 - 500 cells/uL   Basophils Absolute 37 0 - 200 cells/uL   Neutrophils Relative % 50.1 %   Total Lymphocyte 37.2 %   Monocytes Relative 7.2 %   Eosinophils Relative 5.0 %   Basophils Relative 0.5 %  COMPLETE METABOLIC PANEL WITH GFR  Result Value Ref Range   Glucose, Bld 78 65 - 99 mg/dL   BUN 14 7 - 25 mg/dL   Creat 8.11 9.14 - 7.82 mg/dL   GFR, Est Non African American 96 > OR = 60 mL/min/1.27m2   GFR, Est African American 111 > OR = 60 mL/min/1.60m2   BUN/Creatinine Ratio NOT APPLICABLE 6 - 22 (calc)   Sodium 140 135 - 146 mmol/L   Potassium 4.9 3.5 -  5.3 mmol/L   Chloride 106 98 - 110 mmol/L   CO2 27 20 - 32 mmol/L   Calcium 9.3 8.6 - 10.2 mg/dL   Total Protein 6.4 6.1 - 8.1 g/dL   Albumin 4.1 3.6 - 5.1 g/dL   Globulin 2.3 1.9 - 3.7 g/dL (calc)   AG Ratio 1.8 1.0 - 2.5 (calc)   Total Bilirubin 0.5 0.2 - 1.2 mg/dL   Alkaline phosphatase (APISO) 52 31 - 125 U/L   AST 20 10 - 35 U/L   ALT 16 6 - 29 U/L  Lipid panel  Result Value Ref Range   Cholesterol 259 (H) <200 mg/dL   HDL 45 (L) > OR = 50 mg/dL   Triglycerides 956 (H) <150 mg/dL   LDL Cholesterol (Calc) 176 (H) mg/dL (calc)   Total CHOL/HDL Ratio 5.8 (H) <5.0 (calc)   Non-HDL Cholesterol (Calc) 214 (H) <130 mg/dL (calc)  Hemoglobin O1H  Result Value Ref Range   Hgb A1c MFr Bld 5.5 <5.7 % of total Hgb   Mean Plasma Glucose 111 (calc)   eAG (mmol/L) 6.2 (calc)  TSH  Result Value Ref Range   TSH 1.46 mIU/L  HIV Antibody (routine testing w rflx)  Result Value Ref Range   HIV 1&2 Ab, 4th Generation NON-REACTIVE NON-REACTI  T4, free  Result Value Ref Range   Free T4 0.9 0.8 - 1.8 ng/dL  Urinalysis, Routine w reflex microscopic  Result Value Ref Range   Color, Urine YELLOW YELLOW   APPearance CLEAR CLEAR   Specific Gravity, Urine 1.021 1.001 - 1.03   pH 7.0 5.0 -  8.0   Glucose, UA NEGATIVE NEGATIVE   Bilirubin Urine NEGATIVE NEGATIVE   Ketones, ur NEGATIVE NEGATIVE   Hgb urine dipstick NEGATIVE NEGATIVE   Protein, ur NEGATIVE NEGATIVE   Nitrite NEGATIVE NEGATIVE   Leukocytes,Ua NEGATIVE NEGATIVE   Last labs reviewed    Assessment & Plan:   1. Mixed hyperlipidemia Noted the markedly abnormal lipid panel previously, and only was on the statin for about 3 months before stopping. Discussed why I thought this was a good medicine for her to be on to manage.  She tolerated the medicine without side effect concern, including no myalgias. Discussed possibly rechecking a lipid panel today, although she noted it would likely be the same and agree with her.  She wanted to  return to taking the medicine when discussed. Refilled the Lipitor-20 mg daily to restart again Felt best to recheck the lipid panel again approximately 6 to 8 weeks after restarting to help assess, and may need a higher dose pending that recheck. Also continuing with the increased exercise is important, and also dietary modifications to help, with information provided in the AVS to help as well - atorvastatin (LIPITOR) 20 MG tablet; Take 1 tablet (20 mg total) by mouth daily.  Dispense: 90 tablet; Refill: 3  2. Screening for tuberculosis Will obtain as needed for the form to complete - QuantiFERON-TB Gold Plus  3. ASCUS of cervix with negative high risk HPV Noted this in her history, and the importance of having a follow-up Pap done as was recommended. She was informed she would not have that today as my female colleagues at the practice are helpful with these assessments. Also discussed can be referred to OB/GYN if desired, and she would prefer to continue here with this.  She had seen Leisa in the past for this. We will schedule a follow-up to have this done in the very near future  4. Current smoker Complete tobacco cessation recommended, and she has lessened you significantly.  5. Gastroesophageal reflux disease without esophagitis Noted mild reflux symptoms at times when she has spicy foods or some other trigger foods, and trying to minimize them as best can recommended to help manage. Did not feel any chronic medication was needed at this point.  6. Pre-employment examination Form completed, and await the QuantiFERON gold result. Can obtain that result on my chart and attach it to the form that was completed for her today to return.   At the latest, we will follow-up in 6 to 8 weeks time to have her lipid panel repeated, also probably best to make sure liver tests are remaining stable, and if has not had a repeat Pap done by that time, should be obtained at that visit as well and  schedule with one of my female colleagues to have that completed.     Jamelle Haring, MD 07/02/20 9:34 AM

## 2020-07-02 ENCOUNTER — Encounter: Payer: Self-pay | Admitting: Internal Medicine

## 2020-07-02 ENCOUNTER — Ambulatory Visit (INDEPENDENT_AMBULATORY_CARE_PROVIDER_SITE_OTHER): Payer: BC Managed Care – PPO | Admitting: Internal Medicine

## 2020-07-02 ENCOUNTER — Other Ambulatory Visit: Payer: Self-pay

## 2020-07-02 VITALS — BP 118/82 | HR 77 | Temp 98.7°F | Resp 16 | Ht 62.0 in | Wt 131.6 lb

## 2020-07-02 DIAGNOSIS — F17200 Nicotine dependence, unspecified, uncomplicated: Secondary | ICD-10-CM | POA: Diagnosis not present

## 2020-07-02 DIAGNOSIS — Z23 Encounter for immunization: Secondary | ICD-10-CM

## 2020-07-02 DIAGNOSIS — Z021 Encounter for pre-employment examination: Secondary | ICD-10-CM

## 2020-07-02 DIAGNOSIS — R87610 Atypical squamous cells of undetermined significance on cytologic smear of cervix (ASC-US): Secondary | ICD-10-CM

## 2020-07-02 DIAGNOSIS — K219 Gastro-esophageal reflux disease without esophagitis: Secondary | ICD-10-CM

## 2020-07-02 DIAGNOSIS — Z111 Encounter for screening for respiratory tuberculosis: Secondary | ICD-10-CM

## 2020-07-02 DIAGNOSIS — E782 Mixed hyperlipidemia: Secondary | ICD-10-CM | POA: Insufficient documentation

## 2020-07-02 MED ORDER — ATORVASTATIN CALCIUM 20 MG PO TABS: 20.0000 mg | ORAL_TABLET | Freq: Every day | ORAL | 3 refills | Status: DC

## 2020-07-02 NOTE — Patient Instructions (Signed)

## 2020-07-04 LAB — QUANTIFERON-TB GOLD PLUS
Mitogen-NIL: >10 IU/mL
NIL: 0.1 IU/mL
QuantiFERON-TB Gold Plus: POSITIVE — AB
TB1-NIL: 1.47 IU/mL
TB2-NIL: 1.12 IU/mL

## 2020-07-05 ENCOUNTER — Ambulatory Visit
Admission: RE | Admit: 2020-07-05 | Discharge: 2020-07-05 | Disposition: A | Discharge status: Home / Self Care | Payer: BC Managed Care – PPO | Source: Ambulatory Visit | Attending: Internal Medicine | Admitting: Internal Medicine

## 2020-07-05 ENCOUNTER — Other Ambulatory Visit: Payer: Self-pay

## 2020-07-05 ENCOUNTER — Other Ambulatory Visit: Payer: Self-pay | Admitting: Internal Medicine

## 2020-07-05 ENCOUNTER — Telehealth: Payer: Self-pay | Admitting: *Deleted

## 2020-07-05 ENCOUNTER — Ambulatory Visit
Admission: RE | Admit: 2020-07-05 | Discharge: 2020-07-05 | Disposition: A | Discharge status: Home / Self Care | Payer: BC Managed Care – PPO | Attending: Internal Medicine | Admitting: Internal Medicine

## 2020-07-05 DIAGNOSIS — Z227 Latent tuberculosis: Secondary | ICD-10-CM | POA: Diagnosis present

## 2020-07-05 NOTE — Telephone Encounter (Signed)
Patient is calling to get information regarding her Quantiferon Gold test. It is positive and she is inquiring about the meaning of results and next steps- she has to turn the results into her job- [employer] system. Patient informed that her PCP would call her to discuss teste results and any next steps.

## 2020-07-05 NOTE — Progress Notes (Signed)
Patient with + quantiferon gold, CXR ordered. From [place].

## 2020-07-06 ENCOUNTER — Ambulatory Visit: Payer: BC Managed Care – PPO | Admitting: Family Medicine

## 2020-07-06 NOTE — Telephone Encounter (Signed)
Already addressed. [facility]

## 2020-07-06 NOTE — Telephone Encounter (Signed)
This was ordered under you - did you previously address the positive lab?  Or do I need to f/up on it?  Please let me know  Thanks, Sheliah Mends

## 2020-07-07 NOTE — Progress Notes (Deleted)
Patient is a 49 year old female Last visit with me was on 07/02/2020 Her QuantiFERON gold was positive, and a follow-up chest x-ray was read as no active disease. Communication with her via MyChart was as follows:  The blood test for TB screening was positive. We need to get a Chest X-ray presently and it will be ordered for you to get (can get as soon as today recommended). 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 Means you have been exposed to TB and have been infected in the past, with the main concern that it could reactivate and become active TB in the future. I will need to see you back to discuss treatment recommended for this, answer any questions you may have, and then complete the paperwork accordingly for your work.  Follows up today.  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 She has many family members here in [place] Her daughter, who will be attending [employer], was with her last visit  She denies any recent symptoms of concern including no unintentional weight loss, cough, night sweats, fevers, nausea or vomiting, muscle aches, increased fatigue. She denies any history of TB, nor any exposure to TB that she is aware of in her past. After our prior visit, her husband presented to be evaluated and a QuantiFERON gold test was done at that time.  He was not aware of any prior TB exposures in the past.  She does still smoke

## 2020-07-08 ENCOUNTER — Ambulatory Visit: Payer: Self-pay | Admitting: Internal Medicine

## 2020-10-04 ENCOUNTER — Encounter: Payer: Self-pay | Admitting: Family Medicine

## 2020-10-04 ENCOUNTER — Other Ambulatory Visit: Payer: Self-pay

## 2020-10-04 ENCOUNTER — Ambulatory Visit (INDEPENDENT_AMBULATORY_CARE_PROVIDER_SITE_OTHER): Payer: BC Managed Care – PPO | Admitting: Family Medicine

## 2020-10-04 VITALS — BP 110/72 | HR 74 | Temp 98.6°F | Resp 16 | Ht 62.0 in | Wt 128.1 lb

## 2020-10-04 DIAGNOSIS — F17200 Nicotine dependence, unspecified, uncomplicated: Secondary | ICD-10-CM

## 2020-10-04 DIAGNOSIS — Z227 Latent tuberculosis: Secondary | ICD-10-CM | POA: Diagnosis not present

## 2020-10-04 DIAGNOSIS — E782 Mixed hyperlipidemia: Secondary | ICD-10-CM

## 2020-10-04 DIAGNOSIS — Z8669 Personal history of other diseases of the nervous system and sense organs: Secondary | ICD-10-CM | POA: Insufficient documentation

## 2020-10-04 DIAGNOSIS — R634 Abnormal weight loss: Secondary | ICD-10-CM

## 2020-10-04 DIAGNOSIS — Z23 Encounter for immunization: Secondary | ICD-10-CM | POA: Diagnosis not present

## 2020-10-04 DIAGNOSIS — Z5181 Encounter for therapeutic drug level monitoring: Secondary | ICD-10-CM

## 2020-10-04 DIAGNOSIS — R87610 Atypical squamous cells of undetermined significance on cytologic smear of cervix (ASC-US): Secondary | ICD-10-CM

## 2020-10-04 DIAGNOSIS — K219 Gastro-esophageal reflux disease without esophagitis: Secondary | ICD-10-CM | POA: Diagnosis not present

## 2020-10-04 DIAGNOSIS — E010 Iodine-deficiency related diffuse (endemic) goiter: Secondary | ICD-10-CM

## 2020-10-04 DIAGNOSIS — F439 Reaction to severe stress, unspecified: Secondary | ICD-10-CM

## 2020-10-04 NOTE — Assessment & Plan Note (Signed)
Discussed changing guidelines and recommendations, pt was HPV negative, has low risk sexual hx, is still on menses - will have her return for CPE and can wait for f/up PAP/HPV testing until then

## 2020-10-04 NOTE — Assessment & Plan Note (Signed)
Intermittent, triggered by certain foods and spicy food, not on daily meds, sx well controlled currently with diet, no concerns

## 2020-10-04 NOTE — Assessment & Plan Note (Signed)
Still smoking only 2-3 cigarettes a day

## 2020-10-04 NOTE — Assessment & Plan Note (Signed)
Long stable hx from [place], she noted prior testing and monitoring which was always negative, no repeated imaging studies here per pt preference, last labs normal for TSH and free T4, will repeat labs due to unintentional weight loss, no other sx concerning for hyper or hypothyroid

## 2020-10-04 NOTE — Progress Notes (Signed)
Name: Whitney Torres   MRN: [id number]    DOB: 03-05-1971   Date:10/04/2020       Progress Note  Chief Complaint  Patient presents with  . Consult     Subjective:   Whitney Torres is a 49 y.o. 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  Teaching spanish at high school - [employer], also still works at [employer] loss- down 9 lbs Wt Readings from Last 5 Encounters:  10/04/20 128 lb 1.6 oz (58.1 kg)  07/02/20 131 lb 9.6 oz (59.7 kg)  01/29/19 137 lb (62.1 kg)   BMI Readings from Last 5 Encounters:  10/04/20 23.43 kg/m  07/02/20 24.07 kg/m  01/29/19 25.47 kg/m   Hx of thyromegaly, imaging has been consistent for many years, last thyroid labs were normal  Hx of abnormal PAP - ascus March 2020 with neg HPV - married, sexually active with only husband for many years  GERD - intermittent with food triggers, not on meds      Current Outpatient Medications:  .  atorvastatin (LIPITOR) 20 MG tablet, Take 1 tablet (20 mg total) by mouth daily., Disp: 90 tablet, Rfl: 3  Patient Active Problem List   Diagnosis Date Noted  . Mixed hyperlipidemia 07/02/2020  . Gastroesophageal reflux disease without esophagitis 07/02/2020  . Hematuria 01/31/2019  . Thyromegaly 01/31/2019  . Current smoker  01/31/2019  . ASCUS of cervix with negative high risk HPV 01/31/2019    Past Surgical History:  Procedure Laterality Date  . APPENDECTOMY    . CESAREAN SECTION     1999, 2012  . TUBAL LIGATION      Family History  Problem Relation Age of Onset  . Hypertension Mother   . Liver disease Father   . Cancer Maternal Uncle        stomach  . Diabetes Maternal Grandmother   . Cancer Maternal Grandmother 37       colon cancer    Social History   Tobacco Use  . Smoking status: Current Some Day Smoker    Packs/day: 0.25    Years: 10.00    Pack years: 2.50    Types: Cigarettes  . Smokeless tobacco: Never Used  Vaping Use  . Vaping Use: Never used  Substance Use Topics  . Alcohol use: Yes    Alcohol/week: 2.0 standard drinks    Types: 2 Cans of beer per week  . Drug use: Never     No Known Allergies  Health Maintenance  Topic Date Due  . Hepatitis C Screening  Never done  . PAP SMEAR-Modifier  01/29/2020  . TETANUS/TDAP  07/02/2030  . INFLUENZA VACCINE  Completed  . COVID-19 Vaccine  Completed  . HIV Screening  Completed    Chart Review Today: I personally reviewed active problem list,  medication list, allergies, family history, social history, health maintenance, notes from last encounter, lab results, imaging with the patient/caregiver today.   Review of Systems  10 Systems reviewed and are negative for acute change except as noted in the HPI.  Objective:   Vitals:   10/04/20 1328  BP: 110/72  Pulse: 74  Resp: 16  Temp: 98.6 F (37 C)  TempSrc: Oral  SpO2: 98%  Weight: 128 lb 1.6 oz (58.1 kg)  Height: 5\' 2"  (1.575 m)    Body mass index is 23.43 kg/m.  Physical Exam Vitals and nursing note reviewed.  Constitutional:      General: She is not in acute distress.    Appearance: Normal appearance. She is well-developed. She is not ill-appearing, toxic-appearing or diaphoretic.     Interventions: Face mask in place.  HENT:     Head: Normocephalic and  atraumatic.     Right Ear: External ear normal.     Left Ear: External ear normal.  Eyes:     General: Lids are normal. No scleral icterus.       Right eye: No discharge.        Left eye: No discharge.     Conjunctiva/sclera: Conjunctivae normal.  Neck:     Trachea: Phonation normal. No tracheal deviation.  Cardiovascular:     Rate and Rhythm: Normal rate and regular rhythm.     Pulses: Normal pulses.          Radial pulses are 2+ on the right side and 2+ on the left side.       Posterior tibial pulses are 2+ on the right side and 2+ on the left side.     Heart sounds: Normal heart sounds. No murmur heard.  No friction rub. No gallop.   Pulmonary:     Effort: Pulmonary effort is normal. No respiratory distress.     Breath sounds: Normal breath sounds. No stridor. No wheezing, rhonchi or rales.  Chest:     Chest wall: No tenderness.  Abdominal:     General: Bowel sounds are normal. There is no distension.     Palpations: Abdomen is soft.  Musculoskeletal:     Right lower leg: No edema.     Left lower leg: No edema.  Skin:    General: Skin is warm and dry.     Coloration: Skin is not jaundiced or pale.     Findings: No rash.  Neurological:     Mental Status: She is alert.     Motor: No abnormal muscle tone.     Gait: Gait normal.  Psychiatric:        Attention and Perception: Attention normal.        Mood and Affect: Mood and affect normal.        Speech: Speech normal.        Behavior: Behavior normal.         Assessment & Plan:   Problem List Items Addressed This Visit      Digestive   Gastroesophageal reflux disease without esophagitis    Intermittent, triggered by certain foods and spicy food, not on daily meds, sx well controlled currently with diet, no concerns        Endocrine   Thyromegaly    Long stable hx from , she noted prior testing and monitoring which was always negative, no repeated imaging studies here per pt preference, last labs  normal for TSH and free T4, will repeat labs due to  unintentional weight loss, no other sx concerning for hyper or hypothyroid      Relevant Orders   CBC with Differential/Platelet   COMPLETE METABOLIC PANEL WITH GFR   Lipid panel   TSH   T4, free     Other   Current smoker    Still smoking only 2-3 cigarettes a day      ASCUS of cervix with negative high risk HPV    Discussed changing guidelines and recommendations, pt was HPV negative, has low risk sexual hx, is still on menses - will have her return for CPE and can wait for f/up PAP/HPV testing until then      Mixed hyperlipidemia - Primary    Pt started lipitor 07/2019 and never followed up She did see colleague in clinic about 3 months ago, she was off meds at that time Previously took for 3 month w/o SE or concerns, restarted 3 months ago, weight down, overall eating less, will recheck lipid panel and CMP today      Relevant Orders   COMPLETE METABOLIC PANEL WITH GFR   Lipid panel    Other Visit Diagnoses    TB lung, latent       quantiferon gold positive, CXR neg, no sx, pt from [place]    Need for influenza vaccination       Relevant Orders   Flu Vaccine QUAD 6+ mos PF IM (Fluarix Quad PF) (Completed)   Encounter for medication monitoring       Relevant Orders   CBC with Differential/Platelet   COMPLETE METABOLIC PANEL WITH GFR   Lipid panel   TSH   T4, free   Weight loss, unintentional       likely due to decrease caloric intake and stress, with hx of thyromegaly and worsening mood r/o thyroid dysfunction   Relevant Orders   TSH   T4, free   Situational stress       emotional support given, pt is teaching, long hours, she will be going on vacation in the summer, they are ading extra teacher work days due to stress        Return for CPE in the next 6-9 months.   Danelle Berry, PA-C 10/04/20 1:54 PM

## 2020-10-04 NOTE — Assessment & Plan Note (Signed)
Pt started lipitor 07/2019 and never followed up She did see colleague in clinic about 3 months ago, she was off meds at that time Previously took for 3 month w/o SE or concerns, restarted 3 months ago, weight down, overall eating less, will recheck lipid panel and CMP today

## 2020-10-05 LAB — CBC WITH DIFFERENTIAL/PLATELET
Absolute Monocytes: 721 cells/uL (ref 200–950)
Basophils Absolute: 64 cells/uL (ref 0–200)
Basophils Relative: 0.6 %
Eosinophils Absolute: 265 cells/uL (ref 15–500)
Eosinophils Relative: 2.5 %
HCT: 40.8 % (ref 35.0–45.0)
Hemoglobin: 13.9 g/dL (ref 11.7–15.5)
Lymphs Abs: 3837 cells/uL (ref 850–3900)
MCH: 32.3 pg (ref 27.0–33.0)
MCHC: 34.1 g/dL (ref 32.0–36.0)
MCV: 94.9 fL (ref 80.0–100.0)
MPV: 10.5 fL (ref 7.5–12.5)
Monocytes Relative: 6.8 %
Neutro Abs: 5713 cells/uL (ref 1500–7800)
Neutrophils Relative %: 53.9 %
Platelets: 304 10*3/uL (ref 140–400)
RBC: 4.3 10*6/uL (ref 3.80–5.10)
RDW: 12.1 % (ref 11.0–15.0)
Total Lymphocyte: 36.2 %
WBC: 10.6 10*3/uL (ref 3.8–10.8)

## 2020-10-05 LAB — COMPLETE METABOLIC PANEL WITH GFR
AG Ratio: 2.1 (calc) (ref 1.0–2.5)
ALT: 9 U/L (ref 6–29)
AST: 15 U/L (ref 10–35)
Albumin: 4.7 g/dL (ref 3.6–5.1)
Alkaline phosphatase (APISO): 79 U/L (ref 31–125)
BUN: 14 mg/dL (ref 7–25)
CO2: 26 mmol/L (ref 20–32)
Calcium: 9.9 mg/dL (ref 8.6–10.2)
Chloride: 103 mmol/L (ref 98–110)
Creat: 0.75 mg/dL (ref 0.50–1.10)
GFR, Est African American: 109 mL/min/{1.73_m2} (ref >=60)
GFR, Est Non African American: 94 mL/min/{1.73_m2} (ref >=60)
Globulin: 2.2 g/dL (calc) (ref 1.9–3.7)
Glucose, Bld: 77 mg/dL (ref 65–99)
Potassium: 4.5 mmol/L (ref 3.5–5.3)
Sodium: 138 mmol/L (ref 135–146)
Total Bilirubin: 0.4 mg/dL (ref 0.2–1.2)
Total Protein: 6.9 g/dL (ref 6.1–8.1)

## 2020-10-05 LAB — LIPID PANEL
Cholesterol: 156 mg/dL (ref <200)
HDL: 56 mg/dL (ref >=50)
LDL Cholesterol (Calc): 75 mg/dL (calc)
Non-HDL Cholesterol (Calc): 100 mg/dL (calc) (ref <130)
Total CHOL/HDL Ratio: 2.8 (calc) (ref <5.0)
Triglycerides: 151 mg/dL — ABNORMAL HIGH (ref <150)

## 2020-10-05 LAB — TSH: TSH: 1.57 mIU/L

## 2020-10-05 LAB — T4, FREE: Free T4: 1 ng/dL (ref 0.8–1.8)

## 2020-11-28 IMAGING — CR DG CHEST 2V
1 series · 2 of 2 positions shown · non-contrast
Comparison: None.

CLINICAL DATA: 48-year-old female with history of smoking.

EXAM:
CHEST - 2 VIEW

[Series 1: dg chest 2 view · 0.14mm/px · 2 of 2 slices shown]
[im 1/2]
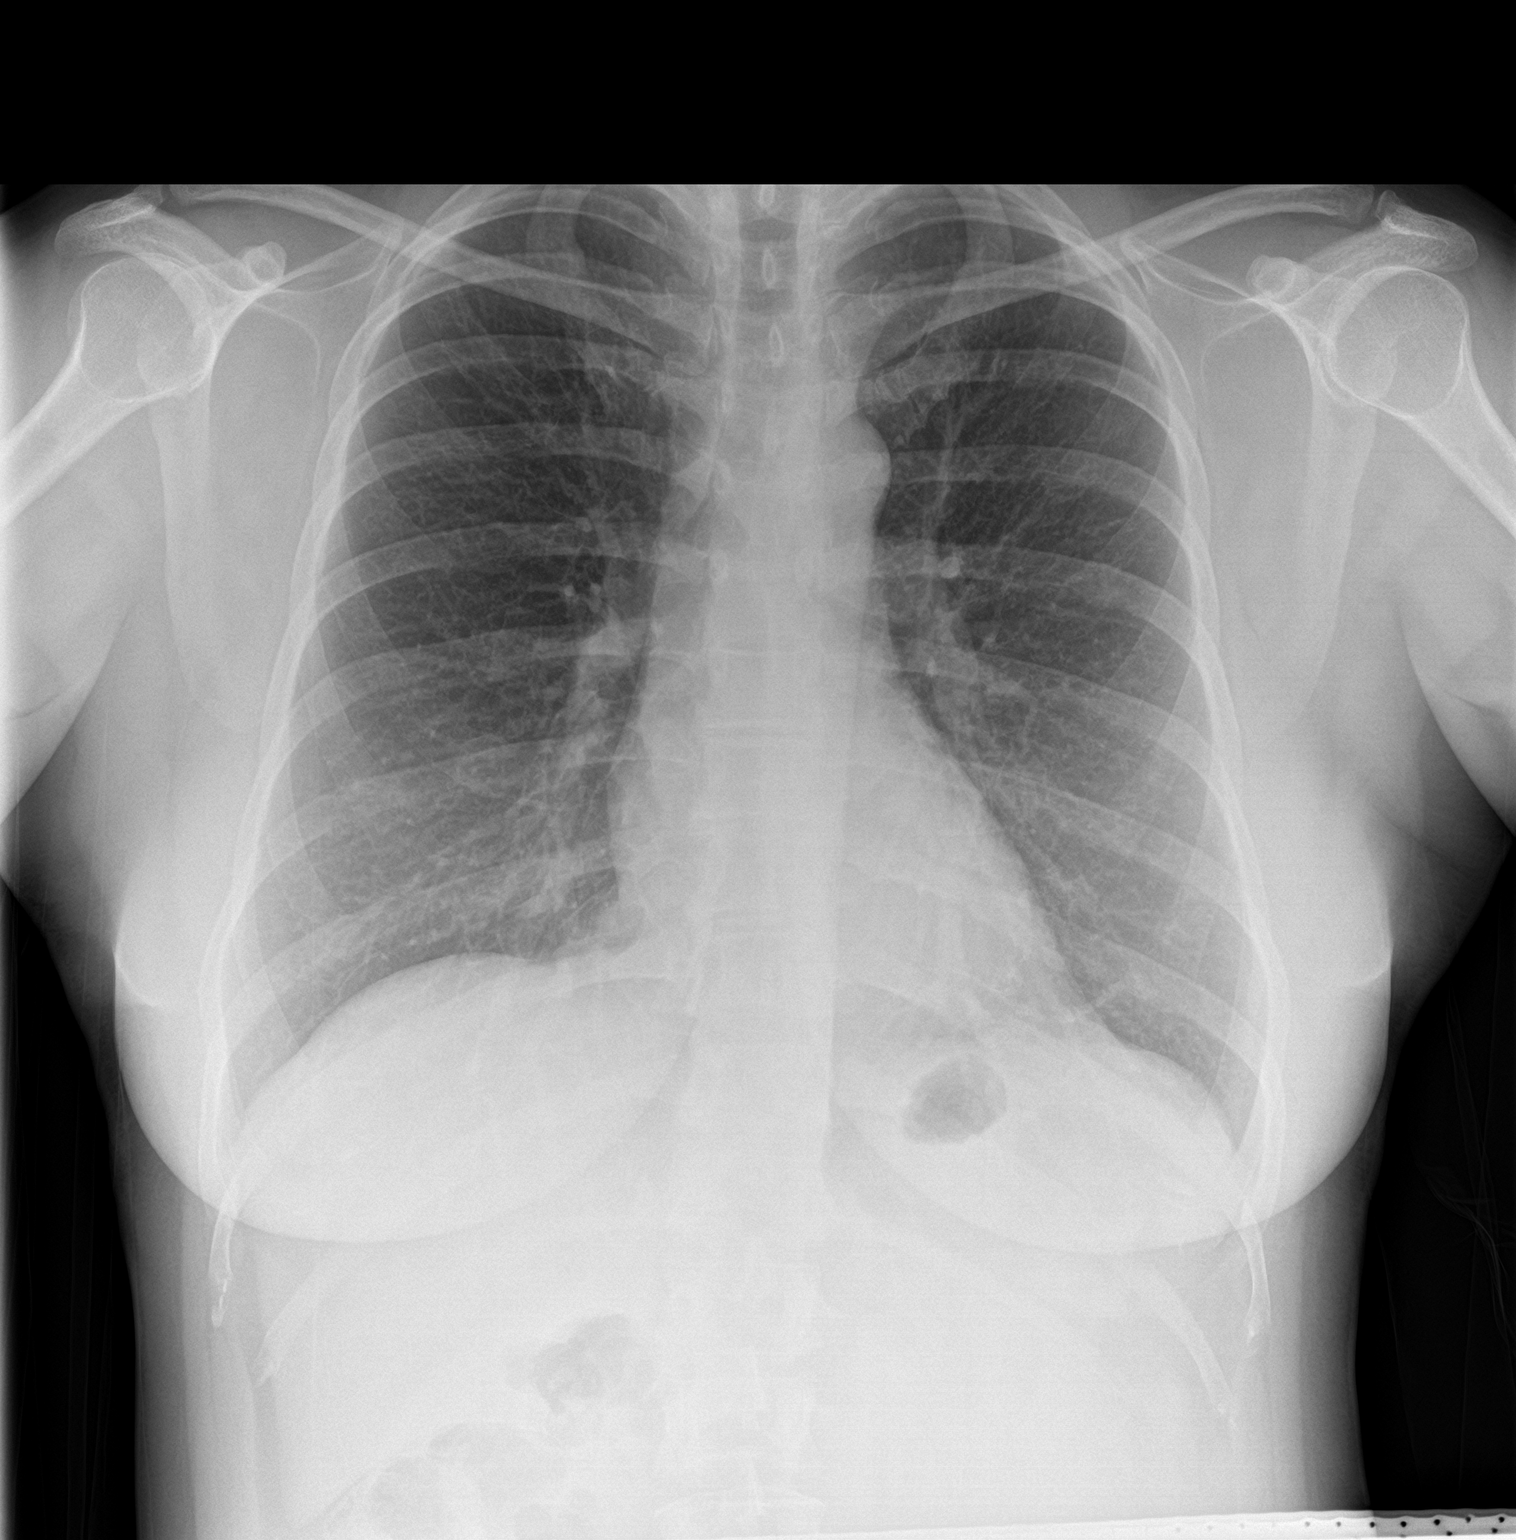
[im 2/2]
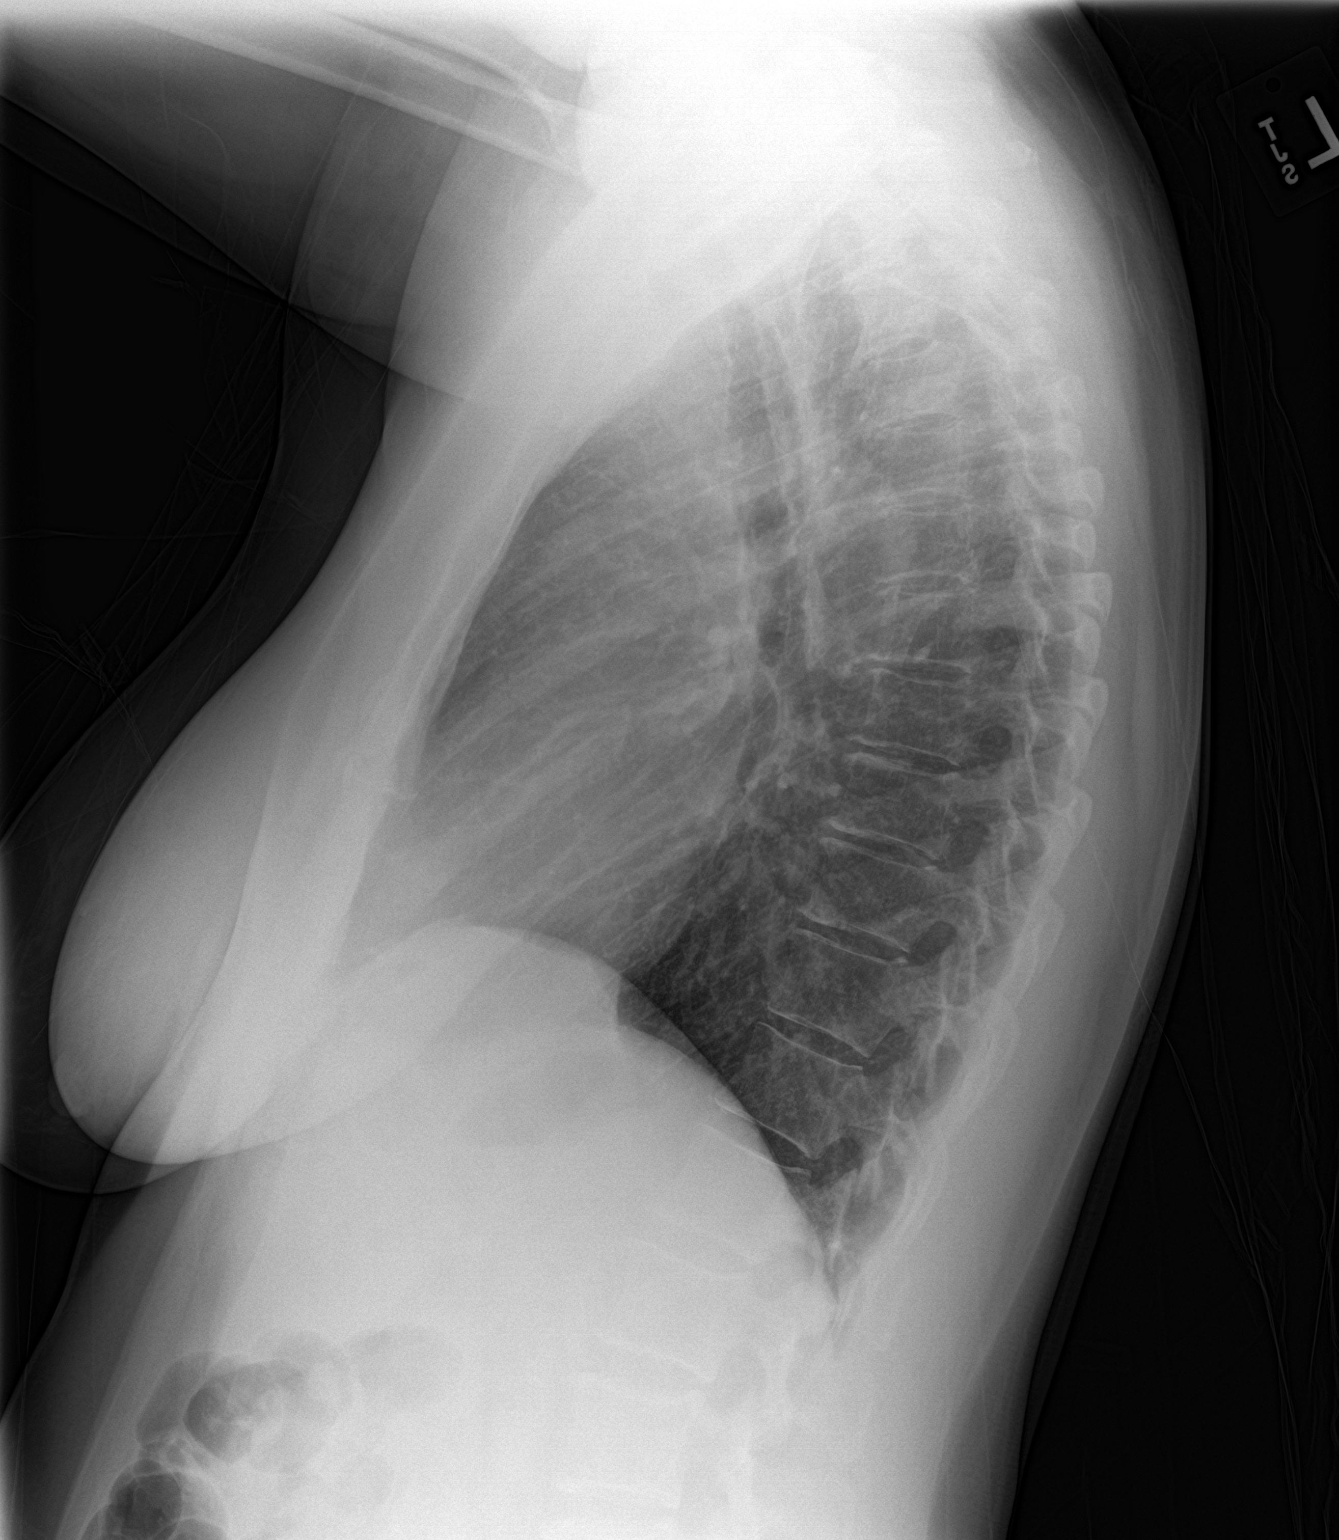

[2 of 2 positions shown; findings below may reference images not displayed]

FINDINGS: The heart size and mediastinal contours are within normal limits.
Both lungs are clear. The visualized skeletal structures are
unremarkable.
IMPRESSION: No active cardiopulmonary disease.

## 2021-10-26 ENCOUNTER — Ambulatory Visit: Payer: Self-pay

## 2021-10-26 NOTE — Telephone Encounter (Signed)
Pt. Reports she has had increased stress and had an incident at school and her BP was 176/107. States BP is coming down - 166/96, 165/90, 154/96, 160/96. Had a headache earlier. No headache now. Requesting appointment. Instructed to go to ED for worsening of symptoms. Warm transfer to Cassandra in the practice for appointment.    Answer Assessment - Initial Assessment Questions 1. BLOOD PRESSURE: "What is the blood pressure?" "Did you take at least two measurements 5 minutes apart?"     176/107   166/96 2. ONSET: "When did you take your blood pressure?"     Today - school nurse 3. HOW: "How did you obtain the blood pressure?" (e.g., visiting nurse, automatic home BP monitor)     Nurse 4. HISTORY: "Do you have a history of high blood pressure?"     No 5. MEDICATIONS: "Are you taking any medications for blood pressure?" "Have you missed any doses recently?"     No 6. OTHER SYMPTOMS: "Do you have any symptoms?" (e.g., headache, chest pain, blurred vision, difficulty breathing, weakness)     Headache 7. PREGNANCY: "Is there any chance you are pregnant?" "When was your last menstrual period?"     No  Protocols used: Blood Pressure - High-A-AH

## 2021-10-26 NOTE — Telephone Encounter (Signed)
Appointment made for 12/1.

## 2021-10-27 ENCOUNTER — Encounter: Payer: Self-pay | Admitting: Internal Medicine

## 2021-10-27 ENCOUNTER — Ambulatory Visit: Payer: BC Managed Care – PPO | Admitting: Internal Medicine

## 2021-10-27 VITALS — BP 125/75 | HR 69 | Temp 98.3°F | Resp 16 | Ht 62.0 in | Wt 130.0 lb

## 2021-10-27 DIAGNOSIS — Z1159 Encounter for screening for other viral diseases: Secondary | ICD-10-CM

## 2021-10-27 DIAGNOSIS — E782 Mixed hyperlipidemia: Secondary | ICD-10-CM

## 2021-10-27 DIAGNOSIS — R079 Chest pain, unspecified: Secondary | ICD-10-CM

## 2021-10-27 DIAGNOSIS — R030 Elevated blood-pressure reading, without diagnosis of hypertension: Secondary | ICD-10-CM

## 2021-10-27 NOTE — Progress Notes (Signed)
Acute Office Visit  Subjective:    Patient ID: Whitney Torres, female    DOB: 1971-06-19, 50 y.o.   MRN: [id number]  Chief Complaint  Patient presents with   Hypertension    HPI Patient is in today for blood pressure. Yesterday at work she got aggrevated [employer]) and her blood pressure was 176/107. She was experiencing some left sided chest pain/pressure and had a headache as well. She also felt short of breath and diaphoretic. After calming down she rechecked her BP and it was 164/90 and 153/90 15 minutes later and her symptoms resolved. She has never had high blood pressure before but did have preeclampsia in pregnancy. She states that her mother had a similar issue when she was going through menopause. The patient is currently perimenopausal with irregular periods but feeling more irritable and tearful.  She has never been treated for high blood pressure before. She currently denies shortness of breath, chest pain, vision changes. She is more stressed because of her job and is looking for a new job. She used to walk frequently for exercise but hasn't had time to do this in the last year. She does eat salted foods frequently.   HLD: -Medications: Lipitor 20 mg -Patient is compliant with above medications and reports no side effects.  -Last lipid panel: 11/21 - TC 156, HDL 56, triglycerides 151, LDL 75  Past Medical History:  Diagnosis Date   Hyperlipidemia     Past Surgical History:  Procedure Laterality Date   APPENDECTOMY     CESAREAN SECTION     1999, 2012   TUBAL LIGATION      Family History  Problem Relation Age of Onset   Hypertension Mother    Liver disease Father    Cancer Maternal Uncle        stomach   Diabetes Maternal Grandmother    Cancer Maternal Grandmother 13       colon cancer    Social History   Socioeconomic History   Marital status: Married    Spouse name: Not on file   Number of children: 2   Years of education: Not on file    Highest education level: Not on file  Occupational History   Not on file  Tobacco Use   Smoking status: Some Days    Packs/day: 0.25    Years: 10.00    Pack years: 2.50    Types: Cigarettes   Smokeless tobacco: Never  Vaping Use   Vaping Use: Never used  Substance and Sexual Activity   Alcohol use: Yes    Alcohol/week: 2.0 standard drinks    Types: 2 Cans of beer per week   Drug use: Never   Sexual activity: Yes  Other Topics Concern   Not on file  Social History Narrative   Not on file   Social Determinants of Health   Financial Resource Strain: Not on file  Food Insecurity: Not on file  Transportation Needs: Not on file  Physical Activity: Not on file  Stress: Not on file  Social Connections: Not on file  Intimate Partner Violence: Not on file    Outpatient Medications Prior to Visit  Medication Sig Dispense Refill   atorvastatin (LIPITOR) 20 MG tablet Take 1 tablet (20 mg total) by mouth daily. 90 tablet 3   No facility-administered medications prior to visit.    No Known Allergies  Review of Systems  Constitutional:  Negative for chills and fever.  Eyes:  Negative for visual disturbance.  Respiratory:  Negative for cough and shortness of breath.   Cardiovascular:  Negative for chest pain, palpitations and leg swelling.  Gastrointestinal:  Negative for abdominal pain.  Neurological:  Negative for dizziness and light-headedness.      Objective:    Physical Exam Constitutional:      Appearance: Normal appearance.  HENT:     Head: Normocephalic and atraumatic.  Eyes:     Conjunctiva/sclera: Conjunctivae normal.  Cardiovascular:     Rate and Rhythm: Normal rate and regular rhythm.  Pulmonary:     Effort: Pulmonary effort is normal.     Breath sounds: Normal breath sounds.  Musculoskeletal:     Right lower leg: No edema.     Left lower leg: No edema.  Skin:    General: Skin is warm and dry.  Neurological:     General: No focal deficit present.      Mental Status: She is alert. Mental status is at baseline.  Psychiatric:        Mood and Affect: Mood normal.        Behavior: Behavior normal.    BP 125/75   Pulse 69   Temp 98.3 F (36.8 C)   Resp 16   Ht 5\' 2"  (1.575 m)   Wt 130 lb (59 kg)   SpO2 99%   BMI 23.78 kg/m  Wt Readings from Last 3 Encounters:  10/27/21 130 lb (59 kg)  10/04/20 128 lb 1.6 oz (58.1 kg)  07/02/20 131 lb 9.6 oz (59.7 kg)    Health Maintenance Due  Topic Date Due   Pneumococcal Vaccine 30-42 Years old (1 - PCV) Never done   Hepatitis C Screening  Never done   COLONOSCOPY (Pts 45-69yrs Insurance coverage will need to be confirmed)  Never done   PAP SMEAR-Modifier  01/29/2020   COVID-19 Vaccine (3 - Booster for Pfizer series) 04/28/2020   INFLUENZA VACCINE  06/27/2021    There are no preventive care reminders to display for this patient.   Lab Results  Component Value Date   TSH 1.57 10/04/2020   Lab Results  Component Value Date   WBC 10.6 10/04/2020   HGB 13.9 10/04/2020   HCT 40.8 10/04/2020   MCV 94.9 10/04/2020   PLT 304 10/04/2020   Lab Results  Component Value Date   NA 138 10/04/2020   K 4.5 10/04/2020   CO2 26 10/04/2020   GLUCOSE 77 10/04/2020   BUN 14 10/04/2020   CREATININE 0.75 10/04/2020   BILITOT 0.4 10/04/2020   AST 15 10/04/2020   ALT 9 10/04/2020   PROT 6.9 10/04/2020   CALCIUM 9.9 10/04/2020   Lab Results  Component Value Date   CHOL 156 10/04/2020   Lab Results  Component Value Date   HDL 56 10/04/2020   Lab Results  Component Value Date   LDLCALC 75 10/04/2020   Lab Results  Component Value Date   TRIG 151 (H) 10/04/2020   Lab Results  Component Value Date   CHOLHDL 2.8 10/04/2020   Lab Results  Component Value Date   HGBA1C 5.5 08/01/2019       Assessment & Plan:   1. Elevated blood pressure reading/Chest pain, unspecified type: I suspect her chest pain may be musculoskeletal in nature but an in office EKG showed sinus bradycardia,  without T wave changes, overall reassuring. No previous EKG to compare. Blood pressure was initially 142/72 but was 125/75 on recheck. We did discuss obtaining a blood pressure cuff and  checking blood pressure at home. She will try lifestyle modifications, including DASH diet, increasing cardiovascular exercise and stress management. Follow up in 1 month for blood pressure recheck. She is due for annual labs so a CBC, CMP will be obtained today.   - EKG 12-Lead - Comprehensive Metabolic Panel (CMET) - CBC w/Diff/Platelet  2. Mixed hyperlipidemia: Stable, on statin. Recheck lipid panel today.  - Lipid Profile  3. Need for hepatitis C screening test: Hepatitis C screening with above labs.   - Hepatitis C Antibody  Follow Up: 1 month  Teodora Medici, DO

## 2021-10-27 NOTE — Patient Instructions (Addendum)
It was great seeing you today!  Plan discussed at today's visit: -Blood work ordered today, results will be uploaded to MyChart.  -For blood pressure, obtain a BP cuff and start checking at home about 2-3 times a week, check after relaxin for 10-15 minutes and keep your arm at the level of your heart -Increase cardiovascular exercise (walking) and decrease sodium according to diet plan below.   Follow up in: 1 month   Take care and let us know if you have any questions or concerns prior to your next visit.  Dr. Caralee Ates  DASH Eating Plan DASH stands for Dietary Approaches to Stop Hypertension. The DASH eating plan is a healthy eating plan that has been shown to: Reduce high blood pressure (hypertension). Reduce your risk for type 2 diabetes, heart disease, and stroke. Help with weight loss. What are tips for following this plan? Reading food labels Check food labels for the amount of salt (sodium) per serving. Choose foods with less than 5 percent of the Daily Value of sodium. Generally, foods with less than 300 milligrams (mg) of sodium per serving fit into this eating plan. To find whole grains, look for the word "whole" as the first word in the ingredient list. Shopping Buy products labeled as "low-sodium" or "no salt added." Buy fresh foods. Avoid canned foods and pre-made or frozen meals. Cooking Avoid adding salt when cooking. Use salt-free seasonings or herbs instead of table salt or sea salt. Check with your health care provider or pharmacist before using salt substitutes. Do not fry foods. Cook foods using healthy methods such as baking, boiling, grilling, roasting, and broiling instead. Cook with heart-healthy oils, such as olive, canola, avocado, soybean, or sunflower oil. Meal planning  Eat a balanced diet that includes: 4 or more servings of fruits and 4 or more servings of vegetables each day. Try to fill one-half of your plate with fruits and vegetables. 6-8 servings of  whole grains each day. Less than 6 oz (170 g) of lean meat, poultry, or fish each day. A 3-oz (85-g) serving of meat is about the same size as a deck of cards. One egg equals 1 oz (28 g). 2-3 servings of low-fat dairy each day. One serving is 1 cup (237 mL). 1 serving of nuts, seeds, or beans 5 times each week. 2-3 servings of heart-healthy fats. Healthy fats called omega-3 fatty acids are found in foods such as walnuts, flaxseeds, fortified milks, and eggs. These fats are also found in cold-water fish, such as sardines, salmon, and mackerel. Limit how much you eat of: Canned or prepackaged foods. Food that is high in trans fat, such as some fried foods. Food that is high in saturated fat, such as fatty meat. Desserts and other sweets, sugary drinks, and other foods with added sugar. Full-fat dairy products. Do not salt foods before eating. Do not eat more than 4 egg yolks a week. Try to eat at least 2 vegetarian meals a week. Eat more home-cooked food and less restaurant, buffet, and fast food. Lifestyle When eating at a restaurant, ask that your food be prepared with less salt or no salt, if possible. If you drink alcohol: Limit how much you use to: 0-1 drink a day for women who are not pregnant. 0-2 drinks a day for men. Be aware of how much alcohol is in your drink. In the [place] one drink equals one 12 oz bottle of beer (355 mL), one 5 oz glass of wine (148 mL), or  one 1 oz glass of hard liquor (44 mL). General information Avoid eating more than 2,300 mg of salt a day. If you have hypertension, you may need to reduce your sodium intake to 1,500 mg a day. Work with your health care provider to maintain a healthy body weight or to lose weight. Ask what an ideal weight is for you. Get at least 30 minutes of exercise that causes your heart to beat faster (aerobic exercise) most days of the week. Activities may include walking, swimming, or biking. Work with your health care provider or  dietitian to adjust your eating plan to your individual calorie needs. What foods should I eat? Fruits All fresh, dried, or frozen fruit. Canned fruit in natural juice (without added sugar). Vegetables Fresh or frozen vegetables (raw, steamed, roasted, or grilled). Low-sodium or reduced-sodium tomato and vegetable juice. Low-sodium or reduced-sodium tomato sauce and tomato paste. Low-sodium or reduced-sodium canned vegetables. Grains Whole-grain or whole-wheat bread. Whole-grain or whole-wheat pasta. Brown rice. Orpah Cobb. Bulgur. Whole-grain and low-sodium cereals. Pita bread. Low-fat, low-sodium crackers. Whole-wheat flour tortillas. Meats and other proteins Skinless chicken or [place]. Ground chicken or [place]. Pork with fat trimmed off. Fish and seafood. Egg whites. Dried beans, peas, or lentils. Unsalted nuts, nut butters, and seeds. Unsalted canned beans. Lean cuts of beef with fat trimmed off. Low-sodium, lean precooked or cured meat, such as sausages or meat loaves. Dairy Low-fat (1%) or fat-free (skim) milk. Reduced-fat, low-fat, or fat-free cheeses. Nonfat, low-sodium ricotta or cottage cheese. Low-fat or nonfat yogurt. Low-fat, low-sodium cheese. Fats and oils Soft margarine without trans fats. Vegetable oil. Reduced-fat, low-fat, or light mayonnaise and salad dressings (reduced-sodium). Canola, safflower, olive, avocado, soybean, and sunflower oils. Avocado. Seasonings and condiments Herbs. Spices. Seasoning mixes without salt. Other foods Unsalted popcorn and pretzels. Fat-free sweets. The items listed above may not be a complete list of foods and beverages you can eat. Contact a dietitian for more information. What foods should I avoid? Fruits Canned fruit in a light or heavy syrup. Fried fruit. Fruit in cream or butter sauce. Vegetables Creamed or fried vegetables. Vegetables in a cheese sauce. Regular canned vegetables (not low-sodium or reduced-sodium). Regular canned  tomato sauce and paste (not low-sodium or reduced-sodium). Regular tomato and vegetable juice (not low-sodium or reduced-sodium). Rosita Fire. Olives. Grains Baked goods made with fat, such as croissants, muffins, or some breads. Dry pasta or rice meal packs. Meats and other proteins Fatty cuts of meat. Ribs. Fried meat. Tomasa Blase. Bologna, salami, and other precooked or cured meats, such as sausages or meat loaves. Fat from the back of a pig (fatback). Bratwurst. Salted nuts and seeds. Canned beans with added salt. Canned or smoked fish. Whole eggs or egg yolks. Chicken or [place] with skin. Dairy Whole or 2% milk, cream, and half-and-half. Whole or full-fat cream cheese. Whole-fat or sweetened yogurt. Full-fat cheese. Nondairy creamers. Whipped toppings. Processed cheese and cheese spreads. Fats and oils Butter. Stick margarine. Lard. Shortening. Ghee. Bacon fat. Tropical oils, such as coconut, palm kernel, or palm oil. Seasonings and condiments Onion salt, garlic salt, seasoned salt, table salt, and sea salt. Worcestershire sauce. Tartar sauce. Barbecue sauce. Teriyaki sauce. Soy sauce, including reduced-sodium. Steak sauce. Canned and packaged gravies. Fish sauce. Oyster sauce. Cocktail sauce. Store-bought horseradish. Ketchup. Mustard. Meat flavorings and tenderizers. Bouillon cubes. Hot sauces. Pre-made or packaged marinades. Pre-made or packaged taco seasonings. Relishes. Regular salad dressings. Other foods Salted popcorn and pretzels. The items listed above may not be a complete list  of foods and beverages you should avoid. Contact a dietitian for more information. Where to find more information National Heart, Lung, and Blood Institute: PopSteam.is American Heart Association: www.heart.org Academy of Nutrition and Dietetics: www.eatright.org National Kidney Foundation: www.kidney.org Summary The DASH eating plan is a healthy eating plan that has been shown to reduce high blood pressure  (hypertension). It may also reduce your risk for type 2 diabetes, heart disease, and stroke. When on the DASH eating plan, aim to eat more fresh fruits and vegetables, whole grains, lean proteins, low-fat dairy, and heart-healthy fats. With the DASH eating plan, you should limit salt (sodium) intake to 2,300 mg a day. If you have hypertension, you may need to reduce your sodium intake to 1,500 mg a day. Work with your health care provider or dietitian to adjust your eating plan to your individual calorie needs. This information is not intended to replace advice given to you by your health care provider. Make sure you discuss any questions you have with your health care provider. Document Revised: 10/17/2019 Document Reviewed: 10/17/2019 Elsevier Patient Education  2022 Elsevier Inc.  Hypertension, Adult High blood pressure (hypertension) is when the force of blood pumping through the arteries is too strong. The arteries are the blood vessels that carry blood from the heart throughout the body. Hypertension forces the heart to work harder to pump blood and may cause arteries to become narrow or stiff. Untreated or uncontrolled hypertension can cause a heart attack, heart failure, a stroke, kidney disease, and other problems. A blood pressure reading consists of a higher number over a lower number. Ideally, your blood pressure should be below 120/80. The first ("top") number is called the systolic pressure. It is a measure of the pressure in your arteries as your heart beats. The second ("bottom") number is called the diastolic pressure. It is a measure of the pressure in your arteries as the heart relaxes. What are the causes? The exact cause of this condition is not known. There are some conditions that result in or are related to high blood pressure. What increases the risk? Some risk factors for high blood pressure are under your control. The following factors may make you more likely to develop  this condition: Smoking. Having type 2 diabetes mellitus, high cholesterol, or both. Not getting enough exercise or physical activity. Being overweight. Having too much fat, sugar, calories, or salt (sodium) in your diet. Drinking too much alcohol. Some risk factors for high blood pressure may be difficult or impossible to change. Some of these factors include: Having chronic kidney disease. Having a family history of high blood pressure. Age. Risk increases with age. Race. You may be at higher risk if you are African American. Gender. Men are at higher risk than women before age 73. After age 32, women are at higher risk than men. Having obstructive sleep apnea. Stress. What are the signs or symptoms? High blood pressure may not cause symptoms. Very high blood pressure (hypertensive crisis) may cause: Headache. Anxiety. Shortness of breath. Nosebleed. Nausea and vomiting. Vision changes. Severe chest pain. Seizures. How is this diagnosed? This condition is diagnosed by measuring your blood pressure while you are seated, with your arm resting on a flat surface, your legs uncrossed, and your feet flat on the floor. The cuff of the blood pressure monitor will be placed directly against the skin of your upper arm at the level of your heart. It should be measured at least twice using the same arm. Certain  conditions can cause a difference in blood pressure between your right and left arms. Certain factors can cause blood pressure readings to be lower or higher than normal for a short period of time: When your blood pressure is higher when you are in a health care provider's office than when you are at home, this is called white coat hypertension. Most people with this condition do not need medicines. When your blood pressure is higher at home than when you are in a health care provider's office, this is called masked hypertension. Most people with this condition may need medicines to control  blood pressure. If you have a high blood pressure reading during one visit or you have normal blood pressure with other risk factors, you may be asked to: Return on a different day to have your blood pressure checked again. Monitor your blood pressure at home for 1 week or longer. If you are diagnosed with hypertension, you may have other blood or imaging tests to help your health care provider understand your overall risk for other conditions. How is this treated? This condition is treated by making healthy lifestyle changes, such as eating healthy foods, exercising more, and reducing your alcohol intake. Your health care provider may prescribe medicine if lifestyle changes are not enough to get your blood pressure under control, and if: Your systolic blood pressure is above 130. Your diastolic blood pressure is above 80. Your personal target blood pressure may vary depending on your medical conditions, your age, and other factors. Follow these instructions at home: Eating and drinking Eat a diet that is high in fiber and potassium, and low in sodium, added sugar, and fat. An example eating plan is called the DASH (Dietary Approaches to Stop Hypertension) diet. To eat this way: Eat plenty of fresh fruits and vegetables. Try to fill one half of your plate at each meal with fruits and vegetables. Eat whole grains, such as whole-wheat pasta, brown rice, or whole-grain bread. Fill about one fourth of your plate with whole grains. Eat or drink low-fat dairy products, such as skim milk or low-fat yogurt. Avoid fatty cuts of meat, processed or cured meats, and poultry with skin. Fill about one fourth of your plate with lean proteins, such as fish, chicken without skin, beans, eggs, or tofu. Avoid pre-made and processed foods. These tend to be higher in sodium, added sugar, and fat. Reduce your daily sodium intake. Most people with hypertension should eat less than 1,500 mg of sodium a day. Do not drink  alcohol if: Your health care provider tells you not to drink. You are pregnant, may be pregnant, or are planning to become pregnant. If you drink alcohol: Limit how much you use to: 0-1 drink a day for women. 0-2 drinks a day for men. Be aware of how much alcohol is in your drink. In the [place] one drink equals one 12 oz bottle of beer (355 mL), one 5 oz glass of wine (148 mL), or one 1 oz glass of hard liquor (44 mL). Lifestyle Work with your health care provider to maintain a healthy body weight or to lose weight. Ask what an ideal weight is for you. Get at least 30 minutes of exercise most days of the week. Activities may include walking, swimming, or biking. Include exercise to strengthen your muscles (resistance exercise), such as Pilates or lifting weights, as part of your weekly exercise routine. Try to do these types of exercises for 30 minutes at least 3 days a  week. Do not use any products that contain nicotine or tobacco, such as cigarettes, e-cigarettes, and chewing tobacco. If you need help quitting, ask your health care provider. Monitor your blood pressure at home as told by your health care provider. Keep all follow-up visits as told by your health care provider. This is important. Medicines Take over-the-counter and prescription medicines only as told by your health care provider. Follow directions carefully. Blood pressure medicines must be taken as prescribed. Do not skip doses of blood pressure medicine. Doing this puts you at risk for problems and can make the medicine less effective. Ask your health care provider about side effects or reactions to medicines that you should watch for. Contact a health care provider if you: Think you are having a reaction to a medicine you are taking. Have headaches that keep coming back (recurring). Feel dizzy. Have swelling in your ankles. Have trouble with your vision. Get help right away if you: Develop a severe headache or  confusion. Have unusual weakness or numbness. Feel faint. Have severe pain in your chest or abdomen. Vomit repeatedly. Have trouble breathing. Summary Hypertension is when the force of blood pumping through your arteries is too strong. If this condition is not controlled, it may put you at risk for serious complications. Your personal target blood pressure may vary depending on your medical conditions, your age, and other factors. For most people, a normal blood pressure is less than 120/80. Hypertension is treated with lifestyle changes, medicines, or a combination of both. Lifestyle changes include losing weight, eating a healthy, low-sodium diet, exercising more, and limiting alcohol. This information is not intended to replace advice given to you by your health care provider. Make sure you discuss any questions you have with your health care provider. Document Revised: 07/24/2018 Document Reviewed: 07/24/2018 Elsevier Patient Education  2022 [employer].

## 2021-10-28 LAB — LIPID PANEL
Cholesterol: 166 mg/dL (ref <200)
HDL: 58 mg/dL (ref >=50)
LDL Cholesterol (Calc): 90 mg/dL (calc)
Non-HDL Cholesterol (Calc): 108 mg/dL (calc) (ref <130)
Total CHOL/HDL Ratio: 2.9 (calc) (ref <5.0)
Triglycerides: 87 mg/dL (ref <150)

## 2021-10-28 LAB — CBC WITH DIFFERENTIAL/PLATELET
Absolute Monocytes: 593 cells/uL (ref 200–950)
Basophils Absolute: 52 cells/uL (ref 0–200)
Basophils Relative: 0.5 %
Eosinophils Absolute: 125 cells/uL (ref 15–500)
Eosinophils Relative: 1.2 %
HCT: 40.9 % (ref 35.0–45.0)
Hemoglobin: 13.8 g/dL (ref 11.7–15.5)
Lymphs Abs: 3286 cells/uL (ref 850–3900)
MCH: 32 pg (ref 27.0–33.0)
MCHC: 33.7 g/dL (ref 32.0–36.0)
MCV: 94.9 fL (ref 80.0–100.0)
MPV: 10.8 fL (ref 7.5–12.5)
Monocytes Relative: 5.7 %
Neutro Abs: 6344 cells/uL (ref 1500–7800)
Neutrophils Relative %: 61 %
Platelets: 285 10*3/uL (ref 140–400)
RBC: 4.31 10*6/uL (ref 3.80–5.10)
RDW: 11.8 % (ref 11.0–15.0)
Total Lymphocyte: 31.6 %
WBC: 10.4 10*3/uL (ref 3.8–10.8)

## 2021-10-28 LAB — COMPREHENSIVE METABOLIC PANEL
AG Ratio: 1.8 (calc) (ref 1.0–2.5)
ALT: 13 U/L (ref 6–29)
AST: 15 U/L (ref 10–35)
Albumin: 4.5 g/dL (ref 3.6–5.1)
Alkaline phosphatase (APISO): 65 U/L (ref 31–125)
BUN: 13 mg/dL (ref 7–25)
CO2: 27 mmol/L (ref 20–32)
Calcium: 9.6 mg/dL (ref 8.6–10.2)
Chloride: 104 mmol/L (ref 98–110)
Creat: 0.64 mg/dL (ref 0.50–0.99)
Globulin: 2.5 g/dL (calc) (ref 1.9–3.7)
Glucose, Bld: 84 mg/dL (ref 65–99)
Potassium: 4.4 mmol/L (ref 3.5–5.3)
Sodium: 140 mmol/L (ref 135–146)
Total Bilirubin: 0.6 mg/dL (ref 0.2–1.2)
Total Protein: 7 g/dL (ref 6.1–8.1)

## 2021-10-28 LAB — HEPATITIS C ANTIBODY
Hepatitis C Ab: NONREACTIVE
SIGNAL TO CUT-OFF: <0.02 (ref <1.00)

## 2021-11-24 ENCOUNTER — Other Ambulatory Visit: Payer: Self-pay

## 2021-11-24 ENCOUNTER — Ambulatory Visit: Payer: BC Managed Care – PPO | Admitting: Internal Medicine

## 2021-11-24 ENCOUNTER — Encounter: Payer: Self-pay | Admitting: Internal Medicine

## 2021-11-24 VITALS — BP 122/84 | HR 59 | Temp 98.2°F | Resp 16 | Ht 62.0 in | Wt 132.8 lb

## 2021-11-24 DIAGNOSIS — E782 Mixed hyperlipidemia: Secondary | ICD-10-CM | POA: Diagnosis not present

## 2021-11-24 DIAGNOSIS — F439 Reaction to severe stress, unspecified: Secondary | ICD-10-CM

## 2021-11-24 DIAGNOSIS — R030 Elevated blood-pressure reading, without diagnosis of hypertension: Secondary | ICD-10-CM

## 2021-11-24 NOTE — Patient Instructions (Addendum)
It was great seeing you today!  Plan discussed at today's visit: -Blood pressure is doing well, continue to work on stress management and continue to check blood pressure about 1-2 times a week -Continue Lipitor  -Can try black cohosh   Follow up in: 3-6 months for annual   Take care and let us know if you have any questions or concerns prior to your next visit.  Dr. Jacobo Forest Cohosh, Cimicifuga racemosa oral dosage forms What is this medication? BLACK COHOSH (blak  KOH hosh) or Cimicifuga racemosa is a dietary supplement. It is promoted to relieve symptoms of menopause, such as hot flashes. The FDA has not approved this supplement for any medical use. This supplement may be used for other purposes; ask your health care provider or pharmacist if you have questions. This medicine may be used for other purposes; ask your health care provider or pharmacist if you have questions. What should I tell my care team before I take this medication? They need to know if you have any of these conditions: breast cancer cervical, ovarian or uterine cancer high blood pressure infertility liver disease menstrual changes or irregular periods unusual vaginal or uterine bleeding an unusual or allergic reaction to black cohosh, soybeans, tartrazine dye (yellow dye number 5), other medicines, foods, dyes, or preservatives pregnant or trying to get pregnant breast-feeding How should I use this medication? Take this herb by mouth with a glass of water. Follow the directions on the package labeling, or talk to your health care professional. Do not use for longer than 6 months without the advice of a health care professional. Do not use if you are pregnant or breast-feeding. Talk to your obstetrician-gynecologist or certified nurse-midwife. This herb is not for use in children under the age of 18 years. Overdosage: If you think you have taken too much of this medicine contact a poison control center or  emergency room at once. NOTE: This medicine is only for you. Do not share this medicine with others. What if I miss a dose? If you miss a dose, take it as soon as you can. If it is almost time for your next dose, take only that dose. Do not take double or extra doses. What may interact with this medication? atorvastatin cisplatin fertility treatments This list may not describe all possible interactions. Give your health care provider a list of all the medicines, herbs, non-prescription drugs, or dietary supplements you use. Also tell them if you smoke, drink alcohol, or use illegal drugs. Some items may interact with your medicine. What should I watch for while using this medication? Since this herb is derived from a plant, allergic reactions are possible. Stop using this herb if you develop a rash. You may need to see your health care professional, or inform them that this occurred. Report any unusual side effects promptly. If you are taking this herb for menstrual or menopausal symptoms, visit your doctor or health care professional for regular checks on your progress. You should have a complete check-up every 6 months. You will need a regular breast and pelvic exam while on this therapy. Follow the advice of your doctor or health care professional. Women should inform their doctor if they wish to become pregnant or think they might be pregnant. If you have any reason to think you are pregnant, stop taking this herb at once and contact your doctor or health care professional. Herbal or dietary supplements are not regulated like medicines. Rigid quality control  standards are not required for dietary supplements. The purity and strength of these products can vary. The safety and effect of this dietary supplement for a certain disease or illness is not well known. This product is not intended to diagnose, treat, cure or prevent any disease. The Food and Drug Administration suggests the following to help  consumers protect themselves: Always read product labels and follow directions. Natural does not mean a product is safe for humans to take. Look for products that include USP after the ingredient name. This means that the manufacturer followed the standards of the [place] Pharmacopoeia. Supplements made or sold by a nationally known food or drug company are more likely to be made under tight controls. You can write to the company for more information about how the product was made. What side effects may I notice from receiving this medication? Side effects that you should report to your doctor or health care professional as soon as possible: allergic reactions like skin rash, itching or hives, swelling of the face, lips, or tongue breathing problems dizziness palpitations signs and symptoms of liver injury like dark yellow or brown urine; general ill feeling or flu-like symptoms; light-colored stools; loss of appetite; nausea; right upper belly pain; unusually weak or tired; yellowing of the eyes or skin unusual vaginal bleeding Side effects that usually do not require medical attention (report to your doctor or health care professional if they continue or are bothersome): breast tenderness headache nausea upset stomach This list may not describe all possible side effects. Call your doctor for medical advice about side effects. You may report side effects to FDA at 1-800-FDA-1088. Where should I keep my medication? Keep out of the reach of children. Store at room temperature between 15 and 30 degrees C (59 and 86 degrees C). Throw away any unused herb after the expiration date. NOTE: This sheet is a summary. It may not cover all possible information. If you have questions about this medicine, talk to your doctor, pharmacist, or health care provider.  2022 Elsevier/Gold Standard (2016-05-24 00:00:00)

## 2021-11-24 NOTE — Progress Notes (Signed)
Established Patient Office Visit  Subjective:  Patient ID: Whitney Torres, female    DOB: [birth year]  Age: 50 y.o. MRN: [id number]  CC:  Chief Complaint  Patient presents with   Follow-up   Hypertension    HPI Whitney Torres presents for follow up on elevated blood pressure.  Elevated Blood Pressure Reading: -At our last visit 1 month ago, the patient had been experiencing high blood pressure at work while under stress and some non-exertional chest pain. BP here had been 142/72 but was 125/75 on recheck. EKG at the time was reassuring. Today she states that she has been working on her stress management with exercise and creating art, which has really helped her stress levels.  -Medications: None -Patient is compliant with above medications and reports no side effects. -Checking BP at home (average): 120-129/75-80 -Denies any SOB, CP, vision changes, LE edema or symptoms of hypotension  HLD: -Medications: Lipitor 20 -Patient is compliant with above medications and reports no side effects. -Last lipid panel: 12/22: TC 166, HDL 58, triglycerides 87, LDL 90  Does have some mood irregularities closer to period, no hot flashes or other menopausal symptoms but would like to know about any supplements/non-medications that could help.   Past Medical History:  Diagnosis Date   Hyperlipidemia     Past Surgical History:  Procedure Laterality Date   APPENDECTOMY     CESAREAN SECTION     1999, 2012   TUBAL LIGATION      Family History  Problem Relation Age of Onset   Hypertension Mother    Liver disease Father    Cancer Maternal Uncle        stomach   Diabetes Maternal Grandmother    Cancer Maternal Grandmother 30       colon cancer    Social History   Socioeconomic History   Marital status: Married    Spouse name: Not on file   Number of children: 2   Years of education: Not on file   Highest education level: Not on file  Occupational History   Not  on file  Tobacco Use   Smoking status: Some Days    Packs/day: 0.25    Years: 10.00    Pack years: 2.50    Types: Cigarettes   Smokeless tobacco: Never  Vaping Use   Vaping Use: Never used  Substance and Sexual Activity   Alcohol use: Yes    Alcohol/week: 2.0 standard drinks    Types: 2 Cans of beer per week   Drug use: Never   Sexual activity: Yes  Other Topics Concern   Not on file  Social History Narrative   Not on file   Social Determinants of Health   Financial Resource Strain: Not on file  Food Insecurity: Not on file  Transportation Needs: Not on file  Physical Activity: Not on file  Stress: Not on file  Social Connections: Not on file  Intimate Partner Violence: Not on file    Outpatient Medications Prior to Visit  Medication Sig Dispense Refill   atorvastatin (LIPITOR) 20 MG tablet Take 1 tablet (20 mg total) by mouth daily. 90 tablet 3   No facility-administered medications prior to visit.    No Known Allergies  ROS Review of Systems  Constitutional:  Negative for chills and fever.  Eyes:  Negative for visual disturbance.  Respiratory:  Negative for shortness of breath.   Cardiovascular:  Negative for chest pain, palpitations and leg swelling.  Neurological:  Negative  for headaches.     Objective:    Physical Exam Constitutional:      Appearance: Normal appearance.  HENT:     Head: Normocephalic and atraumatic.  Eyes:     Conjunctiva/sclera: Conjunctivae normal.  Cardiovascular:     Rate and Rhythm: Normal rate and regular rhythm.  Pulmonary:     Effort: Pulmonary effort is normal.     Breath sounds: Normal breath sounds.  Musculoskeletal:     Right lower leg: No edema.     Left lower leg: No edema.  Skin:    General: Skin is warm and dry.  Neurological:     General: No focal deficit present.     Mental Status: She is alert. Mental status is at baseline.  Psychiatric:        Mood and Affect: Mood normal.        Behavior: Behavior  normal.    BP 122/84    Pulse (!) 59    Temp 98.2 F (36.8 C)    Resp 16    Ht 5\' 2"  (1.575 m)    Wt 132 lb 12.8 oz (60.2 kg)    SpO2 98%    BMI 24.29 kg/m  Wt Readings from Last 3 Encounters:  11/24/21 132 lb 12.8 oz (60.2 kg)  10/27/21 130 lb (59 kg)  10/04/20 128 lb 1.6 oz (58.1 kg)     Health Maintenance Due  Topic Date Due   Pneumococcal Vaccine 93-16 Years old (1 - PCV) Never done   COLONOSCOPY (Pts 45-58yrs Insurance coverage will need to be confirmed)  Never done   PAP SMEAR-Modifier  01/29/2020   COVID-19 Vaccine (3 - Booster for Pfizer series) 04/28/2020   INFLUENZA VACCINE  06/27/2021    There are no preventive care reminders to display for this patient.  Lab Results  Component Value Date   TSH 1.57 10/04/2020   Lab Results  Component Value Date   WBC 10.4 10/27/2021   HGB 13.8 10/27/2021   HCT 40.9 10/27/2021   MCV 94.9 10/27/2021   PLT 285 10/27/2021   Lab Results  Component Value Date   NA 140 10/27/2021   K 4.4 10/27/2021   CO2 27 10/27/2021   GLUCOSE 84 10/27/2021   BUN 13 10/27/2021   CREATININE 0.64 10/27/2021   BILITOT 0.6 10/27/2021   AST 15 10/27/2021   ALT 13 10/27/2021   PROT 7.0 10/27/2021   CALCIUM 9.6 10/27/2021   Lab Results  Component Value Date   CHOL 166 10/27/2021   Lab Results  Component Value Date   HDL 58 10/27/2021   Lab Results  Component Value Date   LDLCALC 90 10/27/2021   Lab Results  Component Value Date   TRIG 87 10/27/2021   Lab Results  Component Value Date   CHOLHDL 2.9 10/27/2021   Lab Results  Component Value Date   HGBA1C 5.5 08/01/2019      Assessment & Plan:   1. Elevated blood pressure reading/Stress: Blood pressure much better today and at home as well. She is doing well with managing stress levels with exercise and art. We did discuss black cohosh with menopausal symptoms. She will follow up in 3-6 months for annual visit and Pap.   2. Mixed hyperlipidemia: Stable, continue statin,  Reviewed last lipid panel with the patient.   Follow-up: Return in 6 months (on 05/25/2022) for CPE w/ pap.    Teodora Medici, DO

## 2022-01-13 ENCOUNTER — Ambulatory Visit: Payer: Self-pay | Admitting: *Deleted

## 2022-01-13 NOTE — Telephone Encounter (Signed)
Message from [facility] sent at 01/13/2022  8:21 AM EST  Summary: sore throat, fever (99.00) difficulty swallowing.   Pt stated that she started with a sore throat fever (99.00) yesterday and is having difficulty swallowing. Pt stated her left tonsil is red and swollen.    No appointments for today.   Seeking clinical advice          Called patient to review sore throat sx via interpreter Annalee Genta [phone]. No answer LVMT[facility] [phone].

## 2022-01-13 NOTE — Telephone Encounter (Signed)
Pt called, she states she has already went to [facility] this morning and been seen. She tested positive for strep throat and was given abx. No further triage needed.

## 2022-01-16 ENCOUNTER — Ambulatory Visit: Payer: BC Managed Care – PPO | Admitting: Physician Assistant

## 2022-05-09 ENCOUNTER — Telehealth: Payer: Self-pay | Admitting: Family Medicine

## 2022-05-09 DIAGNOSIS — K649 Unspecified hemorrhoids: Secondary | ICD-10-CM

## 2022-05-09 MED ORDER — HYDROCORTISONE (PERIANAL) 2.5 % EX CREA: 1.0000 "application " | TOPICAL_CREAM | Freq: Two times a day (BID) | CUTANEOUS | 0 refills | Status: DC

## 2022-05-09 NOTE — Progress Notes (Signed)

## 2022-05-25 NOTE — Progress Notes (Signed)
Established Patient Office Visit  Subjective:  Patient ID: Whitney Torres, female    DOB: 14-Oct-1971  Age: 51 y.o. MRN: [id number]  CC:  Chief Complaint  Patient presents with   Follow-up    HPI Whitney Torres presents for follow up and a few acute complaints today.   Elevated Blood Pressure Reading: -Previously had been experiencing high blood pressure at work while under stress and some non-exertional chest pain. Today she states that she has been working on her stress management with exercise and creating art, which has really helped her stress levels.  -Medications: None -Patient is compliant with above medications and reports no side effects. -Checking BP at home (average): 117/80 this morning was high once last week 169/90 when she was upset with her son  -Denies any SOB, CP, vision changes, LE edema or symptoms of hypotension  HLD: -Medications: Lipitor 20 -Patient is compliant with above medications and reports no side effects. -Last lipid panel: 12/22: Lipid Panel     Component Value Date/Time   CHOL 166 10/27/2021 1420   TRIG 87 10/27/2021 1420   HDL 58 10/27/2021 1420   CHOLHDL 2.9 10/27/2021 1420   LDLCALC 90 10/27/2021 1420   MENOPAUSAL SYMPTOMS LMP 4/23 - every month will get breast tenderness, fatigue, uterine cramping, leg swelling like she will start a cycle but she has not bled in 2 months Hot flashes: no Night sweats: no Sleep disturbances: no Vaginal dryness: no Emotional lability: yes Stress incontinence: no Hysterectomy: no - tubes tied   GERD: -Having acid reflux symptoms with certain foods like vinegar, Tabasco, spices. Will have reflux and mild epigastric/LUQ pain. No nausea, vomiting, no change in appetite. Weight stable. No blood in stools. -Currently on Omeprazole over the counter but uncertain dose as needed, does help - taking about 3-4 days a week  -EGD 15 years ago showing gastritis per patient; cannot view these  results   Hemorrhoids: -Had these since her son was delivered 11 years ago but was lifting some heavy boxes a few weeks ago which triggered some hemorrhoidal bleeding and pain -Using over the counter Preparation H which has been helping, symptoms resolved at this point -BM regular, trying to drink more fluids.   Health Maintenance: -Blood work due -Pap due   Past Medical History:  Diagnosis Date   Hyperlipidemia     Past Surgical History:  Procedure Laterality Date   APPENDECTOMY     CESAREAN SECTION     1999, 2012   TUBAL LIGATION      Family History  Problem Relation Age of Onset   Hypertension Mother    Liver disease Father    Cancer Maternal Uncle        stomach   Diabetes Maternal Grandmother    Cancer Maternal Grandmother 34       colon cancer    Social History   Socioeconomic History   Marital status: Married    Spouse name: Not on file   Number of children: 2   Years of education: Not on file   Highest education level: Not on file  Occupational History   Not on file  Tobacco Use   Smoking status: Some Days    Packs/day: 0.25    Years: 10.00    Total pack years: 2.50    Types: Cigarettes   Smokeless tobacco: Never  Vaping Use   Vaping Use: Never used  Substance and Sexual Activity   Alcohol use: Yes    Alcohol/week: 2.0  standard drinks of alcohol    Types: 2 Cans of beer per week   Drug use: Never   Sexual activity: Yes  Other Topics Concern   Not on file  Social History Narrative   Not on file   Social Determinants of Health   Financial Resource Strain: Not on file  Food Insecurity: Not on file  Transportation Needs: Not on file  Physical Activity: Not on file  Stress: Not on file  Social Connections: Not on file  Intimate Partner Violence: Not on file    Outpatient Medications Prior to Visit  Medication Sig Dispense Refill   atorvastatin (LIPITOR) 20 MG tablet Take 1 tablet (20 mg total) by mouth daily. 90 tablet 3    hydrocortisone (ANUSOL-HC) 2.5 % rectal cream Place 1 application  rectally 2 (two) times daily. (Patient not taking: Reported on 05/26/2022) 30 g 0   No facility-administered medications prior to visit.    No Known Allergies  ROS Review of Systems  Constitutional:  Positive for fatigue. Negative for chills, fever and unexpected weight change.  Eyes:  Negative for visual disturbance.  Respiratory:  Negative for shortness of breath.   Cardiovascular:  Negative for chest pain.  Gastrointestinal:  Positive for abdominal pain. Negative for nausea and vomiting.  Neurological:  Negative for headaches.      Objective:    Physical Exam Constitutional:      Appearance: Normal appearance.  HENT:     Head: Normocephalic and atraumatic.  Eyes:     Conjunctiva/sclera: Conjunctivae normal.  Cardiovascular:     Rate and Rhythm: Normal rate and regular rhythm.  Pulmonary:     Effort: Pulmonary effort is normal.     Breath sounds: Normal breath sounds.  Abdominal:     General: Bowel sounds are normal. There is no distension.     Palpations: Abdomen is soft.     Tenderness: There is no abdominal tenderness. There is no guarding or rebound.  Musculoskeletal:     Right lower leg: No edema.     Left lower leg: No edema.  Skin:    General: Skin is warm and dry.  Neurological:     General: No focal deficit present.     Mental Status: She is alert. Mental status is at baseline.  Psychiatric:        Mood and Affect: Mood normal.        Behavior: Behavior normal.     BP 116/78   Pulse 77   Temp 97.9 F (36.6 C)   Resp 16   Ht 5\' 2"  (1.575 m)   Wt 132 lb 3.2 oz (60 kg)   SpO2 99%   BMI 24.18 kg/m  Wt Readings from Last 3 Encounters:  05/26/22 132 lb 3.2 oz (60 kg)  11/24/21 132 lb 12.8 oz (60.2 kg)  10/27/21 130 lb (59 kg)     Health Maintenance Due  Topic Date Due   PAP SMEAR-Modifier  01/29/2020   COVID-19 Vaccine (3 - Pfizer series) 04/28/2020    There are no  preventive care reminders to display for this patient.  Lab Results  Component Value Date   TSH 1.57 10/04/2020   Lab Results  Component Value Date   WBC 10.4 10/27/2021   HGB 13.8 10/27/2021   HCT 40.9 10/27/2021   MCV 94.9 10/27/2021   PLT 285 10/27/2021   Lab Results  Component Value Date   NA 140 10/27/2021   K 4.4 10/27/2021   CO2 27  10/27/2021   GLUCOSE 84 10/27/2021   BUN 13 10/27/2021   CREATININE 0.64 10/27/2021   BILITOT 0.6 10/27/2021   AST 15 10/27/2021   ALT 13 10/27/2021   PROT 7.0 10/27/2021   CALCIUM 9.6 10/27/2021   Lab Results  Component Value Date   CHOL 166 10/27/2021   Lab Results  Component Value Date   HDL 58 10/27/2021   Lab Results  Component Value Date   LDLCALC 90 10/27/2021   Lab Results  Component Value Date   TRIG 87 10/27/2021   Lab Results  Component Value Date   CHOLHDL 2.9 10/27/2021   Lab Results  Component Value Date   HGBA1C 5.5 08/01/2019      Assessment & Plan:   1. Perimenopause/Irregular periods/Other fatigue: LMP 2 months ago, had tubes tied so risk of pregnancy lower. Will check FSH/LH and if high can refer to Gynecology for estrogen therapy. In the meantime can try black cohosh. No hot flashes currently. Will also check TSH due to fatigue.   - FSH/LH - TSH  2. Hemorrhoids, unspecified hemorrhoid type/Blood in stool: Had been dehydrated and was lifting something heavy that triggered the symptoms. No symptoms now after treating with preparation H. Will check CBC with above labs due to presumed hemorrhoidal bleeding.   - CBC w/Diff/Platelet  3. Gastroesophageal reflux disease, unspecified whether esophagitis present: Discussed lifestyle change and avoiding triggering foods. Will also treat symptoms daily with Protonix 40 mg for 1 month, plan to recheck in month to either decrease dose or refer to GI for EGD.   - pantoprazole (PROTONIX) 40 MG tablet; Take 1 tablet (40 mg total) by mouth daily.  Dispense: 30  tablet; Refill: 3  4. Mixed hyperlipidemia: Stable, continue Lipitor 20 mg daily, refilled today.   - atorvastatin (LIPITOR) 20 MG tablet; Take 1 tablet (20 mg total) by mouth daily.  Dispense: 90 tablet; Refill: 3  5. Elevated blood pressure reading: Blood pressure stable today, continue to work on stress management and monitor at home.   Follow-up: Return in 4 weeks (on 06/23/2022).    Margarita Mail, DO

## 2022-05-26 ENCOUNTER — Encounter: Payer: Self-pay | Admitting: Internal Medicine

## 2022-05-26 ENCOUNTER — Ambulatory Visit: Payer: BC Managed Care – PPO | Admitting: Internal Medicine

## 2022-05-26 VITALS — BP 116/78 | HR 77 | Temp 97.9°F | Resp 16 | Ht 62.0 in | Wt 132.2 lb

## 2022-05-26 DIAGNOSIS — R030 Elevated blood-pressure reading, without diagnosis of hypertension: Secondary | ICD-10-CM

## 2022-05-26 DIAGNOSIS — N951 Menopausal and female climacteric states: Secondary | ICD-10-CM | POA: Diagnosis not present

## 2022-05-26 DIAGNOSIS — K649 Unspecified hemorrhoids: Secondary | ICD-10-CM

## 2022-05-26 DIAGNOSIS — K219 Gastro-esophageal reflux disease without esophagitis: Secondary | ICD-10-CM | POA: Diagnosis not present

## 2022-05-26 DIAGNOSIS — K921 Melena: Secondary | ICD-10-CM

## 2022-05-26 DIAGNOSIS — N926 Irregular menstruation, unspecified: Secondary | ICD-10-CM

## 2022-05-26 DIAGNOSIS — R5383 Other fatigue: Secondary | ICD-10-CM | POA: Diagnosis not present

## 2022-05-26 DIAGNOSIS — E782 Mixed hyperlipidemia: Secondary | ICD-10-CM

## 2022-05-26 MED ORDER — ATORVASTATIN CALCIUM 20 MG PO TABS: 20.0000 mg | ORAL_TABLET | Freq: Every day | ORAL | 3 refills | Status: DC

## 2022-05-26 MED ORDER — PANTOPRAZOLE SODIUM 40 MG PO TBEC: 40.0000 mg | DELAYED_RELEASE_TABLET | Freq: Every day | ORAL | 3 refills | Status: DC

## 2022-05-26 NOTE — Patient Instructions (Addendum)
It was great seeing you today!  Plan discussed at today's visit: -Blood work ordered today, results will be uploaded to MyChart.  -Start Pantoprazole 40 mg daily for 1 month, try to avoid triggering foods -Lipitor refilled today -Try black cohosh for perimenopausal symptoms   Follow up in: 1 month   Take care and let us know if you have any questions or concerns prior to your next visit.  Dr. Jacobo Forest Cohosh Oral Dosage Forms What is this medication? BLACK COHOSH (blak KOH hosh) may relieve the symptoms of menopause. The FDA has not evaluated this supplement for any medical use. It may contain ingredients not listed. Discuss all supplements you are taking with your care team. They can provide you with important safety information. This medicine may be used for other purposes; ask your health care provider or pharmacist if you have questions. What should I tell my care team before I take this medication? They need to know if you have any of these conditions: Breast cancer Cervical, ovarian or uterine cancer High blood pressure Infertility Liver disease Menstrual changes or irregular periods Unusual vaginal or uterine bleeding An unusual or allergic reaction to black cohosh, soybeans, tartrazine dye (yellow dye number 5), other supplements, foods, dyes, or preservatives Pregnant or trying to get pregnant Breast-feeding How should I use this medication? Take this herb by mouth with a glass of water. Follow the directions on the package labeling, or talk to your care team. Do not use for longer than 6 months without the advice of your care team. Do not use if you are pregnant or breast-feeding. Talk to your care team. This supplement is not for use in children under the age of 18 years. Overdosage: If you think you have taken too much of this medicine contact a poison control center or emergency room at once. NOTE: This medicine is only for you. Do not share this medicine with  others. What if I miss a dose? If you miss a dose, take it as soon as you can. If it is almost time for your next dose, take only that dose. Do not take double or extra doses. What may interact with this medication? Atorvastatin Cisplatin Fertility treatments This list may not describe all possible interactions. Give your health care provider a list of all the medicines, herbs, non-prescription drugs, or dietary supplements you use. Also tell them if you smoke, drink alcohol, or use illegal drugs. Some items may interact with your medicine. What should I watch for while using this medication? Since this supplement is derived from a plant, allergic reactions are possible. Stop using this supplement if you develop a rash. You may need to see your care team, or inform them that this occurred. Report any unusual side effects promptly. If you are taking this supplement for menstrual or menopausal symptoms, visit your care team for regular checks on your progress. You should have a complete check-up every 6 months. You will need a regular breast and pelvic exam while on this therapy. Follow the advice of your care team. Women should inform their care team if they wish to become pregnant or think they might be pregnant. If you have any reason to think you are pregnant, stop taking this supplement at once and contact your care team. Herbal or dietary supplements are not regulated like medications. Rigid quality control standards are not required for dietary supplements. The purity and strength of these products can vary. The safety and effect of this  dietary supplement for a certain disease or illness is not well known. This product is not intended to diagnose, treat, cure or prevent any disease. The Food and Drug Administration suggests the following to help consumers protect themselves: Always read product labels and follow directions. Natural does not mean a product is safe for humans to take. Look for  products that include USP after the ingredient name. This means that the manufacturer followed the standards of the [place] Pharmacopoeia. Supplements made or sold by a nationally known food or drug company are more likely to be made under tight controls. You can write to the company for more information about how the product was made. What side effects may I notice from receiving this medication? Side effects that you should report to your care team as soon as possible: Allergic reactions--skin rash, itching, hives, swelling of the face, lips, tongue, or throat Liver injury--right upper belly pain, loss of appetite, nausea, light-colored stool, dark yellow or brown urine, yellowing skin or eyes, unusual weakness or fatigue Side effects that usually do not require medical attention (report to your care team if they continue or are bothersome): Breast pain or tenderness Irregular menstrual cycles or spotting Nausea Upset stomach This list may not describe all possible side effects. Call your doctor for medical advice about side effects. You may report side effects to FDA at 1-800-FDA-1088. Where should I keep my medication? Keep out of the reach of children and pets. Store at room temperature between 15 and 30 degrees C (59 and 86 degrees F). Get rid of any unused supplement after the expiration date. NOTE: This sheet is a summary. It may not cover all possible information. If you have questions about this medicine, talk to your doctor, pharmacist, or health care provider.  2023 Elsevier/Gold Standard (2022-01-11 00:00:00)  Perimenopause Perimenopause is the normal time of a woman's life when the levels of estrogen, the female hormone produced by the ovaries, begin to decrease. This leads to changes in menstrual periods before they stop completely (menopause). Perimenopause can begin 2-8 years before menopause. During perimenopause, the ovaries may or may not produce an egg and a woman can still  become pregnant. What are the causes? This condition is caused by a natural change in hormone levels that happens as you get older. What increases the risk? This condition is more likely to start at an earlier age if you have certain medical conditions or have undergone treatments, including: A tumor of the pituitary gland in the brain. A disease that affects the ovaries and hormone production. Certain cancer treatments, such as chemotherapy or hormone therapy, or radiation therapy on the pelvis. Heavy smoking and excessive alcohol use. Family history of early menopause. What are the signs or symptoms? Perimenopausal changes affect each woman differently. Symptoms of this condition may include: Hot flashes. Irregular menstrual periods. Night sweats. Changes in feelings about sex. This could be a decrease in sex drive or an increased discomfort around your sexuality. Vaginal dryness. Headaches. Mood swings. Depression. Problems sleeping (insomnia). Memory problems or trouble concentrating. Irritability. Tiredness. Weight gain. Anxiety. Trouble getting pregnant. How is this diagnosed? This condition is diagnosed based on your medical history, a physical exam, your age, your menstrual history, and your symptoms. Hormone tests may also be done. How is this treated? In some cases, no treatment is needed. You and your health care provider should make a decision together about whether treatment is necessary. Treatment will be based on your individual condition and preferences.  Various treatments are available, such as: Menopausal hormone therapy (MHT). Medicines to treat specific symptoms. Acupuncture. Vitamin or herbal supplements. Before starting treatment, make sure to let your health care provider know if you have a personal or family history of: Heart disease. Breast cancer. Blood clots. Diabetes. Osteoporosis. Follow these instructions at home: Medicines Take  over-the-counter and prescription medicines only as told by your health care provider. Take vitamin supplements only as told by your health care provider. Talk with your health care provider before starting any herbal supplements. Lifestyle  Do not use any products that contain nicotine or tobacco, such as cigarettes, e-cigarettes, and chewing tobacco. If you need help quitting, ask your health care provider. Get at least 30 minutes of physical activity on 5 or more days each week. Eat a balanced diet that includes fresh fruits and vegetables, whole grains, soybeans, eggs, lean meat, and low-fat dairy. Avoid alcoholic and caffeinated beverages, as well as spicy foods. This may help prevent hot flashes. Get 7-8 hours of sleep each night. Dress in layers that can be removed to help you manage hot flashes. Find ways to manage stress, such as deep breathing, meditation, or journaling. General instructions  Keep track of your menstrual periods, including: When they occur. How heavy they are and how long they last. How much time passes between periods. Keep track of your symptoms, noting when they start, how often you have them, and how long they last. Use vaginal lubricants or moisturizers to help with vaginal dryness and improve comfort during sex. You can still become pregnant if you are having irregular periods. Make sure you use contraception during perimenopause if you do not want to get pregnant. Keep all follow-up visits. This is important. This includes any group therapy or counseling. Contact a health care provider if: You have heavy vaginal bleeding or pass blood clots. Your period lasts more than 2 days longer than normal. Your periods are recurring sooner than 21 days. You bleed after having sex. You have pain during sex. Get help right away if you have: Chest pain, trouble breathing, or trouble talking. Severe depression. Pain when you urinate. Severe headaches. Vision  problems. Summary Perimenopause is the time when a woman's body begins to move into menopause. This may happen naturally or as a result of other health problems or medical treatments. Perimenopause can begin 2-8 years before menopause, and it can last for several years. Perimenopausal symptoms can be managed through medicines, lifestyle changes, and complementary therapies such as acupuncture. This information is not intended to replace advice given to you by your health care provider. Make sure you discuss any questions you have with your health care provider. Document Revised: 04/29/2020 Document Review  Nonsurgical Procedures for Hemorrhoids, Care After This sheet gives you information about how to care for yourself after your procedure. Your health care provider may also give you more specific instructions. If you have problems or questions, contact your health care provider. What can I expect after the procedure? After the procedure, it is common to have: Slight rectal bleeding for a few days. Soreness or a dull ache in the rectal area. Follow these instructions at home: Medicines Take over-the-counter and prescription medicines only as told by your health care provider. Use a stool softener or a bulk laxative as told by your health care provider. Activity  Return to your normal activities as told by your health care provider. Ask your health care provider what activities are safe for you. Do not lift  anything that is heavier than 10 lb (4.5 kg), or the limit that you are told, until your health care provider says that it is safe. Do not sit for long periods of time without moving. Take a walk every day or as told by your health care provider. Managing pain and swelling Take warm sitz baths for 20 minutes, 3-4 times a day to ease pain and discomfort. You may do this in a bathtub or using a portable sitz bath that fits over the toilet. If directed, apply ice to the affected area. Using  ice packs between sitz baths may be helpful. Put ice in a plastic bag. Place a towel between your skin and the bag. Leave the ice on for 20 minutes, 2-3 times a day. Eating and drinking  Eat foods that have a lot of fiber in them, such as whole grains, beans, nuts, fruits, and vegetables. Drink enough fluid to keep your urine pale yellow. General instructions Do not strain to have a bowel movement. Do not spend a long time sitting on the toilet. Keep all follow-up visits as told by your health care provider. This is important. Contact a health care provider if: Your pain medicine is not helping. You have a fever. You become constipated. You continue to have light rectal bleeding for more than a few days. You are unable to pass urine (urinary retention). Get help right away if you have: Very bad rectal pain. Heavy bleeding from your rectum. Summary After the procedure, it is common to have slight rectal bleeding and soreness in the area. Taking warm sitz baths and applying ice may be helpful to relieve the discomfort. Eat foods that have a lot of fiber in them, such as whole grains, beans, nuts, fruits, and vegetables. Get help right away if you have excessive pain or heavy bleeding from your rectum. This information is not intended to replace advice given to you by your health care provider. Make sure you discuss any questions you have with your health care provider. Document Revised: 05/25/2021 Document Reviewed: 05/25/2021 Elsevier Patient Education  2023 [employer]  Gastroesophageal Reflux Disease, Adult  Gastroesophageal reflux (GER) happens when acid from the stomach flows up into the tube that connects the mouth and the stomach (esophagus). Normally, food travels down the esophagus and stays in the stomach to be digested. With GER, food and stomach acid sometimes move back up into the esophagus. You may have a disease called gastroesophageal reflux disease (GERD) if the  reflux: Happens often. Causes frequent or very bad symptoms. Causes problems such as damage to the esophagus. When this happens, the esophagus becomes sore and swollen. Over time, GERD can make small holes (ulcers) in the lining of the esophagus. What are the causes? This condition is caused by a problem with the muscle between the esophagus and the stomach. When this muscle is weak or not normal, it does not close properly to keep food and acid from coming back up from the stomach. The muscle can be weak because of: Tobacco use. Pregnancy. Having a certain type of hernia (hiatal hernia). Alcohol use. Certain foods and drinks, such as coffee, chocolate, onions, and peppermint. What increases the risk? Being overweight. Having a disease that affects your connective tissue. Taking NSAIDs, such a ibuprofen. What are the signs or symptoms? Heartburn. Difficult or painful swallowing. The feeling of having a lump in the throat. A bitter taste in the mouth. Bad breath. Having a lot of saliva. Having an upset or  bloated stomach. Burping. Chest pain. Different conditions can cause chest pain. Make sure you see your doctor if you have chest pain. Shortness of breath or wheezing. A long-term cough or a cough at night. Wearing away of the surface of teeth (tooth enamel). Weight loss. How is this treated? Making changes to your diet. Taking medicine. Having surgery. Treatment will depend on how bad your symptoms are. Follow these instructions at home: Eating and drinking  Follow a diet as told by your doctor. You may need to avoid foods and drinks such as: Coffee and tea, with or without caffeine. Drinks that contain alcohol. Energy drinks and sports drinks. Bubbly (carbonated) drinks or sodas. Chocolate and cocoa. Peppermint and mint flavorings. Garlic and onions. Horseradish. Spicy and acidic foods. These include peppers, chili powder, curry powder, vinegar, hot sauces, and BBQ  sauce. Citrus fruit juices and citrus fruits, such as oranges, lemons, and limes. Tomato-based foods. These include red sauce, chili, salsa, and pizza with red sauce. Fried and fatty foods. These include donuts, french fries, potato chips, and high-fat dressings. High-fat meats. These include hot dogs, rib eye steak, sausage, ham, and bacon. High-fat dairy items, such as whole milk, butter, and cream cheese. Eat small meals often. Avoid eating large meals. Avoid drinking large amounts of liquid with your meals. Avoid eating meals during the 2-3 hours before bedtime. Avoid lying down right after you eat. Do not exercise right after you eat. Lifestyle  Do not smoke or use any products that contain nicotine or tobacco. If you need help quitting, ask your doctor. Try to lower your stress. If you need help doing this, ask your doctor. If you are overweight, lose an amount of weight that is healthy for you. Ask your doctor about a safe weight loss goal. General instructions Pay attention to any changes in your symptoms. Take over-the-counter and prescription medicines only as told by your doctor. Do not take aspirin, ibuprofen, or other NSAIDs unless your doctor says it is okay. Wear loose clothes. Do not wear anything tight around your waist. Raise (elevate) the head of your bed about 6 inches (15 cm). You may need to use a wedge to do this. Avoid bending over if this makes your symptoms worse. Keep all follow-up visits. Contact a doctor if: You have new symptoms. You lose weight and you do not know why. You have trouble swallowing or it hurts to swallow. You have wheezing or a cough that keeps happening. You have a hoarse voice. Your symptoms do not get better with treatment. Get help right away if: You have sudden pain in your arms, neck, jaw, teeth, or back. You suddenly feel sweaty, dizzy, or light-headed. You have chest pain or shortness of breath. You vomit and the vomit is green,  yellow, or black, or it looks like blood or coffee grounds. You faint. Your poop (stool) is red, bloody, or black. You cannot swallow, drink, or eat. These symptoms may represent a serious problem that is an emergency. Do not wait to see if the symptoms will go away. Get medical help right away. Call your local emergency services (911 in the [place] Do not drive yourself to the hospital. Summary If a person has gastroesophageal reflux disease (GERD), food and stomach acid move back up into the esophagus and cause symptoms or problems such as damage to the esophagus. Treatment will depend on how bad your symptoms are. Follow a diet as told by your doctor. Take all medicines only as told  by your doctor. This information is not intended to replace advice given to you by your health care provider. Make sure you discuss any questions you have with your health care provider. Document Revised: 05/24/2020 Document Reviewed: 05/24/2020 Elsevier Patient Education  2023 [employer].

## 2022-05-27 LAB — CBC WITH DIFFERENTIAL/PLATELET
Absolute Monocytes: 739 cells/uL (ref 200–950)
Basophils Absolute: 42 cells/uL (ref 0–200)
Basophils Relative: 0.5 %
Eosinophils Absolute: 269 cells/uL (ref 15–500)
Eosinophils Relative: 3.2 %
HCT: 39.8 % (ref 35.0–45.0)
Hemoglobin: 13.5 g/dL (ref 11.7–15.5)
Lymphs Abs: 2856 cells/uL (ref 850–3900)
MCH: 32 pg (ref 27.0–33.0)
MCHC: 33.9 g/dL (ref 32.0–36.0)
MCV: 94.3 fL (ref 80.0–100.0)
MPV: 11.2 fL (ref 7.5–12.5)
Monocytes Relative: 8.8 %
Neutro Abs: 4494 cells/uL (ref 1500–7800)
Neutrophils Relative %: 53.5 %
Platelets: 232 10*3/uL (ref 140–400)
RBC: 4.22 10*6/uL (ref 3.80–5.10)
RDW: 12.9 % (ref 11.0–15.0)
Total Lymphocyte: 34 %
WBC: 8.4 10*3/uL (ref 3.8–10.8)

## 2022-05-27 LAB — FSH/LH
FSH: 10.9 m[IU]/mL
LH: 11.2 m[IU]/mL

## 2022-05-27 LAB — TSH: TSH: 1.92 mIU/L

## 2022-06-26 ENCOUNTER — Ambulatory Visit: Payer: BC Managed Care – PPO | Admitting: Internal Medicine

## 2022-06-26 NOTE — Progress Notes (Deleted)
Established Patient Office Visit  Subjective:  Patient ID: Whitney Torres, female    DOB: 1971-03-18  Age: 51 y.o. MRN: [id number]  CC:  No chief complaint on file.   HPI Whitney Torres presents for follow up and a few acute complaints today.   Elevated Blood Pressure Reading: -Previously had been experiencing high blood pressure at work while under stress and some non-exertional chest pain. Today she states that she has been working on her stress management with exercise and creating art, which has really helped her stress levels.  -Medications: None -Patient is compliant with above medications and reports no side effects. -Checking BP at home (average): 117/80 this morning was high once last week 169/90 when she was upset with her son  -Denies any SOB, CP, vision changes, LE edema or symptoms of hypotension  HLD: -Medications: Lipitor 20 -Patient is compliant with above medications and reports no side effects. -Last lipid panel: 12/22: Lipid Panel     Component Value Date/Time   CHOL 166 10/27/2021 1420   TRIG 87 10/27/2021 1420   HDL 58 10/27/2021 1420   CHOLHDL 2.9 10/27/2021 1420   LDLCALC 90 10/27/2021 1420   MENOPAUSAL SYMPTOMS LMP 4/23 - every month will get breast tenderness, fatigue, uterine cramping, leg swelling like she will start a cycle but she has not bled in 2 months Hot flashes: no Night sweats: no Sleep disturbances: no Vaginal dryness: no Emotional lability: yes Stress incontinence: no Hysterectomy: no - tubes tied  FSH/LH last month normal. Discussed trying black cohosh.   GERD: -Having acid reflux symptoms with certain foods like vinegar, Tabasco, spices. Will have reflux and mild epigastric/LUQ pain. No nausea, vomiting, no change in appetite. Weight stable. No blood in stools. -At LOV switched from Omeprazole to Protonix 40 mg daily  -EGD 15 years ago showing gastritis per patient; cannot view these results    Hemorrhoids: -Had these since her son was delivered 11 years ago but was lifting some heavy boxes a few weeks ago which triggered some hemorrhoidal bleeding and pain -Using over the counter Preparation H which has been helping, symptoms resolved at this point -BM regular, trying to drink more fluids.   Health Maintenance: -Blood work due -Pap due   Past Medical History:  Diagnosis Date   Hyperlipidemia     Past Surgical History:  Procedure Laterality Date   APPENDECTOMY     CESAREAN SECTION     1999, 2012   TUBAL LIGATION      Family History  Problem Relation Age of Onset   Hypertension Mother    Liver disease Father    Cancer Maternal Uncle        stomach   Diabetes Maternal Grandmother    Cancer Maternal Grandmother 59       colon cancer    Social History   Socioeconomic History   Marital status: Married    Spouse name: Not on file   Number of children: 2   Years of education: Not on file   Highest education level: Not on file  Occupational History   Not on file  Tobacco Use   Smoking status: Some Days    Packs/day: 0.25    Years: 10.00    Total pack years: 2.50    Types: Cigarettes   Smokeless tobacco: Never  Vaping Use   Vaping Use: Never used  Substance and Sexual Activity   Alcohol use: Yes    Alcohol/week: 2.0 standard drinks of alcohol  Types: 2 Cans of beer per week   Drug use: Never   Sexual activity: Yes  Other Topics Concern   Not on file  Social History Narrative   Not on file   Social Determinants of Health   Financial Resource Strain: Not on file  Food Insecurity: Not on file  Transportation Needs: Not on file  Physical Activity: Not on file  Stress: Not on file  Social Connections: Not on file  Intimate Partner Violence: Not on file    Outpatient Medications Prior to Visit  Medication Sig Dispense Refill   atorvastatin (LIPITOR) 20 MG tablet Take 1 tablet (20 mg total) by mouth daily. 90 tablet 3   hydrocortisone  (ANUSOL-HC) 2.5 % rectal cream Place 1 application  rectally 2 (two) times daily. (Patient not taking: Reported on 05/26/2022) 30 g 0   pantoprazole (PROTONIX) 40 MG tablet Take 1 tablet (40 mg total) by mouth daily. 30 tablet 3   No facility-administered medications prior to visit.    No Known Allergies  ROS Review of Systems  Constitutional:  Positive for fatigue. Negative for chills, fever and unexpected weight change.  Eyes:  Negative for visual disturbance.  Respiratory:  Negative for shortness of breath.   Cardiovascular:  Negative for chest pain.  Gastrointestinal:  Positive for abdominal pain. Negative for nausea and vomiting.  Neurological:  Negative for headaches.      Objective:    Physical Exam Constitutional:      Appearance: Normal appearance.  HENT:     Head: Normocephalic and atraumatic.  Eyes:     Conjunctiva/sclera: Conjunctivae normal.  Cardiovascular:     Rate and Rhythm: Normal rate and regular rhythm.  Pulmonary:     Effort: Pulmonary effort is normal.     Breath sounds: Normal breath sounds.  Abdominal:     General: Bowel sounds are normal. There is no distension.     Palpations: Abdomen is soft.     Tenderness: There is no abdominal tenderness. There is no guarding or rebound.  Musculoskeletal:     Right lower leg: No edema.     Left lower leg: No edema.  Skin:    General: Skin is warm and dry.  Neurological:     General: No focal deficit present.     Mental Status: She is alert. Mental status is at baseline.  Psychiatric:        Mood and Affect: Mood normal.        Behavior: Behavior normal.     There were no vitals taken for this visit. Wt Readings from Last 3 Encounters:  05/26/22 132 lb 3.2 oz (60 kg)  11/24/21 132 lb 12.8 oz (60.2 kg)  10/27/21 130 lb (59 kg)     Health Maintenance Due  Topic Date Due   PAP SMEAR-Modifier  01/29/2020   COVID-19 Vaccine (3 - Pfizer series) 04/28/2020    There are no preventive care reminders  to display for this patient.  Lab Results  Component Value Date   TSH 1.92 05/26/2022   Lab Results  Component Value Date   WBC 8.4 05/26/2022   HGB 13.5 05/26/2022   HCT 39.8 05/26/2022   MCV 94.3 05/26/2022   PLT 232 05/26/2022   Lab Results  Component Value Date   NA 140 10/27/2021   K 4.4 10/27/2021   CO2 27 10/27/2021   GLUCOSE 84 10/27/2021   BUN 13 10/27/2021   CREATININE 0.64 10/27/2021   BILITOT 0.6 10/27/2021  AST 15 10/27/2021   ALT 13 10/27/2021   PROT 7.0 10/27/2021   CALCIUM 9.6 10/27/2021   Lab Results  Component Value Date   CHOL 166 10/27/2021   Lab Results  Component Value Date   HDL 58 10/27/2021   Lab Results  Component Value Date   LDLCALC 90 10/27/2021   Lab Results  Component Value Date   TRIG 87 10/27/2021   Lab Results  Component Value Date   CHOLHDL 2.9 10/27/2021   Lab Results  Component Value Date   HGBA1C 5.5 08/01/2019      Assessment & Plan:   1. Perimenopause/Irregular periods/Other fatigue: LMP 2 months ago, had tubes tied so risk of pregnancy lower. Will check FSH/LH and if high can refer to Gynecology for estrogen therapy. In the meantime can try black cohosh. No hot flashes currently. Will also check TSH due to fatigue.   - FSH/LH - TSH  2. Hemorrhoids, unspecified hemorrhoid type/Blood in stool: Had been dehydrated and was lifting something heavy that triggered the symptoms. No symptoms now after treating with preparation H. Will check CBC with above labs due to presumed hemorrhoidal bleeding.   - CBC w/Diff/Platelet  3. Gastroesophageal reflux disease, unspecified whether esophagitis present: Discussed lifestyle change and avoiding triggering foods. Will also treat symptoms daily with Protonix 40 mg for 1 month, plan to recheck in month to either decrease dose or refer to GI for EGD.   - pantoprazole (PROTONIX) 40 MG tablet; Take 1 tablet (40 mg total) by mouth daily.  Dispense: 30 tablet; Refill: 3  4. Mixed  hyperlipidemia: Stable, continue Lipitor 20 mg daily, refilled today.   - atorvastatin (LIPITOR) 20 MG tablet; Take 1 tablet (20 mg total) by mouth daily.  Dispense: 90 tablet; Refill: 3  5. Elevated blood pressure reading: Blood pressure stable today, continue to work on stress management and monitor at home.   Follow-up: No follow-ups on file.    Margarita Mail, DO

## 2022-08-22 ENCOUNTER — Other Ambulatory Visit: Payer: Self-pay | Admitting: Internal Medicine

## 2022-08-22 DIAGNOSIS — K219 Gastro-esophageal reflux disease without esophagitis: Secondary | ICD-10-CM

## 2022-08-22 NOTE — Telephone Encounter (Signed)
Requested Prescriptions  Pending Prescriptions Disp Refills  . pantoprazole (PROTONIX) 40 MG tablet [Pharmacy Med Name: PANTOPRAZOLE SOD DR 40 MG TAB] 90 tablet 0    Sig: TAKE 1 TABLET BY MOUTH EVERY DAY     Gastroenterology: Proton Pump Inhibitors Passed - 08/22/2022  2:18 AM      Passed - Valid encounter within last 12 months    Recent Outpatient Visits          2 months ago Gastroesophageal reflux disease, unspecified whether esophagitis present   [place], DO   9 months ago Elevated blood pressure reading   [place], DO   9 months ago Elevated blood pressure reading   [place], DO   1 year ago Mixed hyperlipidemia   [facility] Delsa Grana, PA-C   2 years ago Mixed hyperlipidemia   [facility] Towanda Malkin, MD      Future Appointments            In 2 weeks Teodora Medici, [facility], [facility]

## 2022-09-11 ENCOUNTER — Encounter: Payer: Self-pay | Admitting: Internal Medicine

## 2022-09-11 ENCOUNTER — Ambulatory Visit (INDEPENDENT_AMBULATORY_CARE_PROVIDER_SITE_OTHER): Payer: BC Managed Care – PPO | Admitting: Internal Medicine

## 2022-09-11 VITALS — BP 118/62 | HR 61 | Temp 98.1°F | Resp 16 | Ht 62.0 in | Wt 135.4 lb

## 2022-09-11 DIAGNOSIS — K219 Gastro-esophageal reflux disease without esophagitis: Secondary | ICD-10-CM | POA: Diagnosis not present

## 2022-09-11 DIAGNOSIS — Z1211 Encounter for screening for malignant neoplasm of colon: Secondary | ICD-10-CM

## 2022-09-11 DIAGNOSIS — Z23 Encounter for immunization: Secondary | ICD-10-CM

## 2022-09-11 DIAGNOSIS — E782 Mixed hyperlipidemia: Secondary | ICD-10-CM

## 2022-09-11 DIAGNOSIS — K649 Unspecified hemorrhoids: Secondary | ICD-10-CM | POA: Diagnosis not present

## 2022-09-11 DIAGNOSIS — R030 Elevated blood-pressure reading, without diagnosis of hypertension: Secondary | ICD-10-CM

## 2022-09-11 DIAGNOSIS — Z1231 Encounter for screening mammogram for malignant neoplasm of breast: Secondary | ICD-10-CM

## 2022-09-11 NOTE — Patient Instructions (Addendum)
It was great seeing you today!  Plan discussed at today's visit: -Mammogram ordered today -Referral to GI for EGD and colonoscopy  -Plan for fasting labs next time; please fast 8-12 hours  -Plan for Pap next time as well   Follow up in: 6 months   Take care and let us know if you have any questions or concerns prior to your next visit.  Dr. Rosana Berger

## 2022-09-11 NOTE — Progress Notes (Signed)
Established Patient Office Visit  Subjective:  Patient ID: Whitney Torres, female    DOB: [birth year]  Age: 51 y.o. MRN: [id number]  CC:  Chief Complaint  Patient presents with   Follow-up   Hypertension   Hyperlipidemia    HPI Whitney Torres presents for follow up.  Elevated Blood Pressure Reading: -Previously had been experiencing high blood pressure at work while under stress and some non-exertional chest pain. Today she states that she has been working on her stress management with exercise and creating art, which has really helped her stress levels.  -Medications: None -Patient is compliant with above medications and reports no side effects. -Denies any SOB, CP, vision changes, LE edema or symptoms of hypotension  HLD: -Medications: Lipitor 20 mg -Patient is compliant with above medications and reports no side effects. -Last lipid panel: 12/22: Lipid Panel     Component Value Date/Time   CHOL 166 10/27/2021 1420   TRIG 87 10/27/2021 1420   HDL 58 10/27/2021 1420   CHOLHDL 2.9 10/27/2021 1420   LDLCALC 90 10/27/2021 1420   GERD: -Has been complaining of acid reflux symptoms with certain foods like vinegar, Tabasco, spices. Will have reflux and mild epigastric/LUQ pain. No nausea, vomiting, no change in appetite. Weight stable. No blood in stools. - At her last office visit she was started on Protonix 40 mg.  Patient states she took this every day which controlled her symptoms, however she did not want to be dependent on medication so she stopped it.  Her acid reflux symptoms continue, however she was able to control her diet and avoid triggering foods, which has helped.  She will take apple cider vinegar at night, which is helped her symptoms as well as baking soda in the morning. -EGD 15 years ago showing gastritis per patient; cannot view these results.  Patient currently denying dark stools or blood in the stools  Hemorrhoids: -Had these since her son  was delivered 11 years ago but was lifting some heavy boxes a few weeks ago which triggered some hemorrhoidal bleeding and pain -Using over the counter Preparation H which has been helping, symptoms resolved at this point -BM regular, trying to drink more fluids.   Health Maintenance: -Blood work UTD -Pap due  -Colon cancer screening due -Mammogram due  Past Medical History:  Diagnosis Date   Hyperlipidemia     Past Surgical History:  Procedure Laterality Date   APPENDECTOMY     CESAREAN SECTION     1999, 2012   TUBAL LIGATION      Family History  Problem Relation Age of Onset   Hypertension Mother    Liver disease Father    Cancer Maternal Uncle        stomach   Diabetes Maternal Grandmother    Cancer Maternal Grandmother 9       colon cancer    Social History   Socioeconomic History   Marital status: Married    Spouse name: Not on file   Number of children: 2   Years of education: Not on file   Highest education level: Not on file  Occupational History   Not on file  Tobacco Use   Smoking status: Some Days    Packs/day: 0.25    Years: 10.00    Total pack years: 2.50    Types: Cigarettes   Smokeless tobacco: Never  Vaping Use   Vaping Use: Never used  Substance and Sexual Activity   Alcohol use: Yes  Alcohol/week: 2.0 standard drinks of alcohol    Types: 2 Cans of beer per week   Drug use: Never   Sexual activity: Yes  Other Topics Concern   Not on file  Social History Narrative   Not on file   Social Determinants of Health   Financial Resource Strain: Not on file  Food Insecurity: Not on file  Transportation Needs: Not on file  Physical Activity: Not on file  Stress: Not on file  Social Connections: Not on file  Intimate Partner Violence: Not on file    Outpatient Medications Prior to Visit  Medication Sig Dispense Refill   atorvastatin (LIPITOR) 20 MG tablet Take 1 tablet (20 mg total) by mouth daily. 90 tablet 3   pantoprazole  (PROTONIX) 40 MG tablet TAKE 1 TABLET BY MOUTH EVERY DAY 90 tablet 0   hydrocortisone (ANUSOL-HC) 2.5 % rectal cream Place 1 application  rectally 2 (two) times daily. (Patient not taking: Reported on 05/26/2022) 30 g 0   No facility-administered medications prior to visit.    No Known Allergies  ROS Review of Systems  Constitutional:  Negative for chills and fatigue.  Eyes:  Negative for visual disturbance.  Respiratory:  Negative for shortness of breath.   Cardiovascular:  Negative for chest pain.  Gastrointestinal:  Negative for abdominal pain, blood in stool, nausea and vomiting.  Neurological:  Negative for headaches.      Objective:    Physical Exam Constitutional:      Appearance: Normal appearance.  HENT:     Head: Normocephalic and atraumatic.  Eyes:     Conjunctiva/sclera: Conjunctivae normal.  Cardiovascular:     Rate and Rhythm: Normal rate and regular rhythm.  Pulmonary:     Effort: Pulmonary effort is normal.     Breath sounds: Normal breath sounds.  Musculoskeletal:     Right lower leg: No edema.     Left lower leg: No edema.  Skin:    General: Skin is warm and dry.  Neurological:     General: No focal deficit present.     Mental Status: She is alert. Mental status is at baseline.  Psychiatric:        Mood and Affect: Mood normal.        Behavior: Behavior normal.     BP 118/62   Pulse 61   Temp 98.1 F (36.7 C)   Resp 16   Ht 5\' 2"  (1.575 m)   Wt 135 lb 6.4 oz (61.4 kg)   SpO2 99%   BMI 24.76 kg/m  Wt Readings from Last 3 Encounters:  09/11/22 135 lb 6.4 oz (61.4 kg)  05/26/22 132 lb 3.2 oz (60 kg)  11/24/21 132 lb 12.8 oz (60.2 kg)     Health Maintenance Due  Topic Date Due   PAP SMEAR-Modifier  01/29/2020   MAMMOGRAM  Never done    There are no preventive care reminders to display for this patient.  Lab Results  Component Value Date   TSH 1.92 05/26/2022   Lab Results  Component Value Date   WBC 8.4 05/26/2022   HGB 13.5  05/26/2022   HCT 39.8 05/26/2022   MCV 94.3 05/26/2022   PLT 232 05/26/2022   Lab Results  Component Value Date   NA 140 10/27/2021   K 4.4 10/27/2021   CO2 27 10/27/2021   GLUCOSE 84 10/27/2021   BUN 13 10/27/2021   CREATININE 0.64 10/27/2021   BILITOT 0.6 10/27/2021   AST 15 10/27/2021  ALT 13 10/27/2021   PROT 7.0 10/27/2021   CALCIUM 9.6 10/27/2021   Lab Results  Component Value Date   CHOL 166 10/27/2021   Lab Results  Component Value Date   HDL 58 10/27/2021   Lab Results  Component Value Date   LDLCALC 90 10/27/2021   Lab Results  Component Value Date   TRIG 87 10/27/2021   Lab Results  Component Value Date   CHOLHDL 2.9 10/27/2021   Lab Results  Component Value Date   HGBA1C 5.5 08/01/2019      Assessment & Plan:   1. Gastroesophageal reflux disease without esophagitis/Hemorrhoids, unspecified hemorrhoid type/Colon cancer screening: Acid reflux symptoms controlled now that she is trying to avoid triggering foods.  She states that apple cider vinegar and baking soda is helping her symptoms.  She is no longer on a PPI.  She does have history of gastritis on EGD from 15 years ago as well as family history of stomach cancer and colon cancer.  She will be referred to GI for both repeat EGD and colonoscopy for colon cancer screening and hemorrhoids.  - Ambulatory referral to Gastroenterology  2. Encounter for screening mammogram for malignant neoplasm of breast: Mammogram ordered today.  - MM 3D SCREEN BREAST BILATERAL; Future  3. Need for influenza vaccination: Flu vaccine administered. - Flu Vaccine QUAD 6+ mos PF IM (Fluarix Quad PF)  4. Elevated blood pressure reading: Blood pressure stable.  Continue to monitor.  Not currently on medications.  5. Mixed hyperlipidemia: Stable.  Continue Lipitor 20 mg, plan to recheck fasting labs at follow-up in 6 months.   Follow-up: Return for cpe w/ pap.    Margarita Mail, DO

## 2022-09-19 ENCOUNTER — Telehealth: Payer: Self-pay | Admitting: *Deleted

## 2022-09-19 NOTE — Telephone Encounter (Signed)
This patient was transferred to my phone and she left a voicemail. According to the referral, patient is not returning my call. Not sure what the phone call is for.  Patient has an appointment with Dr Vicente Males on 01/16/2023.  Please call patient back. Thank you.

## 2022-11-22 ENCOUNTER — Other Ambulatory Visit: Payer: Self-pay | Admitting: Internal Medicine

## 2022-11-22 DIAGNOSIS — K219 Gastro-esophageal reflux disease without esophagitis: Secondary | ICD-10-CM

## 2022-11-23 NOTE — Telephone Encounter (Signed)
Rx was dc'd 09/11/22 .   Requested Prescriptions  Pending Prescriptions Disp Refills   pantoprazole (PROTONIX) 40 MG tablet [Pharmacy Med Name: PANTOPRAZOLE SOD DR 40 MG TAB] 90 tablet 0    Sig: TAKE 1 TABLET BY MOUTH EVERY DAY     Gastroenterology: Proton Pump Inhibitors Passed - 11/22/2022  2:04 AM      Passed - Valid encounter within last 12 months    Recent Outpatient Visits           2 months ago Gastroesophageal reflux disease without esophagitis   [facility] Margarita Mail, DO   6 months ago Gastroesophageal reflux disease, unspecified whether esophagitis present   [facility] Margarita Mail, DO   12 months ago Elevated blood pressure reading   [facility] Margarita Mail, DO   1 year ago Elevated blood pressure reading   [facility] Margarita Mail, DO   2 years ago Mixed hyperlipidemia   [facility] Danelle Berry, PA-C       Future Appointments             In 3 months Margarita Mail, DO [facility], [facility]

## 2023-01-16 ENCOUNTER — Encounter: Payer: Self-pay | Admitting: Gastroenterology

## 2023-01-16 ENCOUNTER — Ambulatory Visit (INDEPENDENT_AMBULATORY_CARE_PROVIDER_SITE_OTHER): Payer: BC Managed Care – PPO | Admitting: Gastroenterology

## 2023-01-16 VITALS — BP 126/82 | HR 72 | Temp 98.7°F | Ht 62.0 in | Wt 131.4 lb

## 2023-01-16 DIAGNOSIS — Z1211 Encounter for screening for malignant neoplasm of colon: Secondary | ICD-10-CM

## 2023-01-16 DIAGNOSIS — K219 Gastro-esophageal reflux disease without esophagitis: Secondary | ICD-10-CM

## 2023-01-16 DIAGNOSIS — Z800 Family history of malignant neoplasm of digestive organs: Secondary | ICD-10-CM | POA: Diagnosis not present

## 2023-01-16 MED ORDER — NA SULFATE-K SULFATE-MG SULF 17.5-3.13-1.6 GM/177ML PO SOLN: 1.0000 | Freq: Once | ORAL | 0 refills | Status: AC

## 2023-01-16 NOTE — Progress Notes (Signed)
Jonathon Bellows MD, MRCP(U.K) [address]  [place]  [place], [place] [address]  Main: [phone]  Fax: [phone]   Gastroenterology Consultation  Referring Provider:     Teodora Medici, DO Primary Care Physician:  Teodora Medici, DO Primary Gastroenterologist:  Dr. Jonathon Bellows  Reason for Consultation:     GERD, colon cancer screening         HPI:   Whitney Torres is a 52 y.o. y/o female referred for consultation & management  by Dr. Teodora Medici, DO.    She has been referred for GERD, not on a PPI, family history of gastric cancer and colon cancer .  She states that has never had a colonoscopy for screening at grand mother had colon cancer.  She denies any rectal bleeding change in bowel habits change in the shape of her stool constipation or diarrhea.  She has had reflux for many years previously was on a PPI over the past few months she has changed her diet, lost weight intentionally change the timing of her meals and has symptoms less than 1 episode per 2 weeks.  Denies any abdominal discomfort of dysphagia.  Her uncle had gastric cancer and had a history of H. pylori.  She has had reflux for many years till date.  Past Medical History:  Diagnosis Date   Hyperlipidemia     Past Surgical History:  Procedure Laterality Date   [place], 2012   TUBAL LIGATION      Prior to Admission medications   Medication Sig Start Date End Date Taking? Authorizing Provider  atorvastatin (LIPITOR) 20 MG tablet Take 1 tablet (20 mg total) by mouth daily. 05/26/22   Teodora Medici, DO    Family History  Problem Relation Age of Onset   Hypertension Mother    Liver disease Father    Cancer Maternal Uncle        stomach   Diabetes Maternal Grandmother    Cancer Maternal Grandmother 60       colon cancer     Social History   Tobacco Use   Smoking status: Some Days    Packs/day: 0.25    Years: 10.00    Total pack  years: 2.50    Types: Cigarettes   Smokeless tobacco: Never  Vaping Use   Vaping Use: Never used  Substance Use Topics   Alcohol use: Yes    Alcohol/week: 2.0 standard drinks of alcohol    Types: 2 Cans of beer per week   Drug use: Never    Allergies as of 01/16/2023   (No Known Allergies)    Review of Systems:    All systems reviewed and negative except where noted in HPI.   Physical Exam:  BP (!) 152/96   Pulse 66   Temp 98.7 F (37.1 C) (Oral)   Ht 5' 2"$  (1.575 m)   Wt 131 lb 6 oz (59.6 kg)   BMI 24.03 kg/m  No LMP recorded. (Menstrual status: Perimenopausal).   Vitals:   01/16/23 1416 01/16/23 1456  BP: (!) 152/96 126/82  Pulse: 66 72  Temp: 98.7 F (37.1 C)     Psych:  Alert and cooperative. Normal mood and affect. General:   Alert,  Well-developed, well-nourished, pleasant and cooperative in NAD Head:  Normocephalic and atraumatic. Eyes:  Sclera clear, no icterus.   Conjunctiva pink. Neurologic:  Alert and oriented x3;  grossly normal neurologically. Psych:  Alert and cooperative. Normal mood and affect.  Imaging Studies: No results found.  Assessment and Plan:   Whitney Torres is a 52 y.o. y/o female has been referred for GERD,Colon cancer screening . F/h of gastric cancer and colon cancer    Plan  EGD+colonoscopy to screen for Barrett's esophagus and colon cancer screening average risk H pylori breath test  GERD : Counseled on life style changes,  Advised on the use of a wedge pillow at night , avoid meals for 2 hours prior to bed time. Weight loss .  Her symptoms are presently very seldom once in 2 weeks hence can use Tums as needed and does not require a PPI patient information provided   I have discussed alternative options, risks & benefits,  which include, but are not limited to, bleeding, infection, perforation,respiratory complication & drug reaction.  The patient agrees with this plan & written consent will be obtained.     Follow  up ias needed   Dr Jonathon Bellows MD,MRCP(U.K)

## 2023-01-16 NOTE — Patient Instructions (Signed)
  [place]ow

## 2023-01-16 NOTE — Addendum Note (Signed)
Addended by: Jacqualin Combes on: 01/16/2023 03:06 PM   Modules accepted: Orders

## 2023-01-18 LAB — H PYLORI BREATH TEST: H pylori Breath Test: NEGATIVE

## 2023-02-12 ENCOUNTER — Telehealth: Payer: Self-pay | Admitting: *Deleted

## 2023-02-12 MED ORDER — NA SULFATE-K SULFATE-MG SULF 17.5-3.13-1.6 GM/177ML PO SOLN: 1.0000 | Freq: Once | ORAL | 0 refills | Status: AC

## 2023-02-12 NOTE — Telephone Encounter (Signed)
Patient was transfer from Endo unit [facility]). Patient stated that she did not pick up her prep solution that was sent on the office visit on 01/16/2023.  I have re-send the prep solution to [facility] as requested. Patient will need to pick up in the next 7 days or pharmacy will put it up again.  Patient verbalized understanding.

## 2023-02-19 ENCOUNTER — Encounter
Admission: RE | Disposition: A | Discharge status: Home / Self Care | Payer: Self-pay | Source: Ambulatory Visit | Attending: Gastroenterology

## 2023-02-19 ENCOUNTER — Ambulatory Visit: Payer: BC Managed Care – PPO | Admitting: Registered Nurse

## 2023-02-19 ENCOUNTER — Ambulatory Visit
Admission: RE | Admit: 2023-02-19 | Discharge: 2023-02-19 | Disposition: A | Discharge status: Home / Self Care | Payer: BC Managed Care – PPO | Source: Ambulatory Visit | Attending: Gastroenterology | Admitting: Gastroenterology

## 2023-02-19 DIAGNOSIS — D122 Benign neoplasm of ascending colon: Secondary | ICD-10-CM | POA: Diagnosis not present

## 2023-02-19 DIAGNOSIS — K449 Diaphragmatic hernia without obstruction or gangrene: Secondary | ICD-10-CM | POA: Diagnosis not present

## 2023-02-19 DIAGNOSIS — D126 Benign neoplasm of colon, unspecified: Secondary | ICD-10-CM | POA: Diagnosis not present

## 2023-02-19 DIAGNOSIS — K219 Gastro-esophageal reflux disease without esophagitis: Secondary | ICD-10-CM | POA: Diagnosis not present

## 2023-02-19 DIAGNOSIS — Z1211 Encounter for screening for malignant neoplasm of colon: Secondary | ICD-10-CM

## 2023-02-19 DIAGNOSIS — Z800 Family history of malignant neoplasm of digestive organs: Secondary | ICD-10-CM

## 2023-02-19 HISTORY — PX: ESOPHAGOGASTRODUODENOSCOPY (EGD) WITH PROPOFOL: SHX5813

## 2023-02-19 HISTORY — PX: COLONOSCOPY WITH PROPOFOL: SHX5780

## 2023-02-19 SURGERY — ESOPHAGOGASTRODUODENOSCOPY (EGD) WITH PROPOFOL
Anesthesia: General

## 2023-02-19 MED ORDER — PROPOFOL 10 MG/ML IV BOLUS
INTRAVENOUS | Status: DC | PRN
  Administered 2023-02-19: 20 mg via INTRAVENOUS
  Administered 2023-02-19: 60 mg via INTRAVENOUS
  Administered 2023-02-19: 30 mg via INTRAVENOUS

## 2023-02-19 MED ORDER — GLYCOPYRROLATE 0.2 MG/ML IJ SOLN
INTRAMUSCULAR | Status: DC | PRN
  Administered 2023-02-19: .2 mg via INTRAVENOUS

## 2023-02-19 MED ORDER — PROPOFOL 500 MG/50ML IV EMUL
INTRAVENOUS | Status: DC | PRN
  Administered 2023-02-19: 150 ug/kg/min via INTRAVENOUS

## 2023-02-19 MED ORDER — SODIUM CHLORIDE 0.9 % IV SOLN
INTRAVENOUS | Status: DC
  Administered 2023-02-19: 20 mL/h via INTRAVENOUS

## 2023-02-19 MED ORDER — PHENYLEPHRINE HCL (PRESSORS) 10 MG/ML IV SOLN
INTRAVENOUS | Status: DC | PRN
  Administered 2023-02-19: 80 ug via INTRAVENOUS

## 2023-02-19 MED ORDER — LIDOCAINE HCL (CARDIAC) PF 100 MG/5ML IV SOSY
PREFILLED_SYRINGE | INTRAVENOUS | Status: DC | PRN
  Administered 2023-02-19: 60 mg via INTRAVENOUS

## 2023-02-19 MED ORDER — LIDOCAINE HCL (PF) 2 % IJ SOLN
INTRAMUSCULAR | Status: AC
  Filled 2023-02-19: qty 10

## 2023-02-19 MED ORDER — PROPOFOL 10 MG/ML IV BOLUS
INTRAVENOUS | Status: AC
  Filled 2023-02-19: qty 20

## 2023-02-19 NOTE — Op Note (Signed)
[facility] Patient Name: Whitney Torres Procedure Date: 02/19/2023 10:54 AM MRN: [id number] Account #: [id number] Date of Birth: 1971/10/29 Admit Type: Outpatient Age: 52 Room: [facility] ENDO ROOM 1 Gender: Female Note Status: Finalized Instrument Name: Upper Endoscope [id number] Procedure:             Upper GI endoscopy Indications:           Exclusion of Barrett's esophagus Providers:             Jonathon Bellows MD, MD Referring MD:          No Local Md, MD (Referring MD) Medicines:             Monitored Anesthesia Care Complications:         No immediate complications. Procedure:             Pre-Anesthesia Assessment:                        - Prior to the procedure, a History and Physical was                         performed, and patient medications, allergies and                         sensitivities were reviewed. The patient's tolerance                         of previous anesthesia was reviewed.                        - The risks and benefits of the procedure and the                         sedation options and risks were discussed with the                         patient. All questions were answered and informed                         consent was obtained.                        - After reviewing the risks and benefits, the patient                         was deemed in satisfactory condition to undergo the                         procedure.                        - ASA Grade Assessment: II - A patient with mild                         systemic disease.                        After obtaining informed consent, the endoscope was                         passed under  direct vision. Throughout the procedure,                         the patient's blood pressure, pulse, and oxygen                         saturations were monitored continuously. The Endoscope                         was introduced through the mouth, and advanced to the                          third part of duodenum. The upper GI endoscopy was                         accomplished with ease. The patient tolerated the                         procedure well. Findings:      A medium-sized hiatal hernia was present.      The examined duodenum was normal.      The esophagus was normal.      The cardia and gastric fundus were normal on retroflexion. Impression:            - Medium-sized hiatal hernia.                        - Normal examined duodenum.                        - Normal esophagus.                        - No specimens collected. Recommendation:        - Perform a colonoscopy today. Procedure Code(s):     --- Professional ---                        [phone], Esophagogastroduodenoscopy, flexible,                         transoral; diagnostic, including collection of                         specimen(s) by brushing or washing, when performed                         (separate procedure) Diagnosis Code(s):     --- Professional ---                        K44.9, Diaphragmatic hernia without obstruction or                         gangrene CPT copyright 2022 American Medical Association. All rights reserved. The codes documented in this report are preliminary and upon coder review may  be revised to meet current compliance requirements. Jonathon Bellows, MD Jonathon Bellows MD, MD 02/19/2023 11:14:41 AM This report has been signed electronically. Number of Addenda: 0 Note Initiated On: 02/19/2023 10:54 AM Estimated Blood Loss:  Estimated blood loss: none.      [facility]

## 2023-02-19 NOTE — Anesthesia Preprocedure Evaluation (Signed)
Anesthesia Evaluation  Patient identified by MRN, date of birth, ID band Patient awake    Reviewed: Allergy & Precautions, NPO status , Patient's Chart, lab work & pertinent test results  History of Anesthesia Complications Negative for: history of anesthetic complications  Airway Mallampati: II  TM Distance: >3 FB Neck ROM: Full    Dental no notable dental hx. (+) Teeth Intact   Pulmonary neg sleep apnea, neg COPD, Patient abstained from smoking.Not current smoker, former smoker   Pulmonary exam normal breath sounds clear to auscultation       Cardiovascular Exercise Tolerance: Good METS(-) hypertension(-) CAD and (-) Past MI negative cardio ROS (-) dysrhythmias  Rhythm:Regular Rate:Normal - Systolic murmurs    Neuro/Psych negative neurological ROS  negative psych ROS   GI/Hepatic ,GERD  ,,(+)     (-) substance abuse    Endo/Other  neg diabetes    Renal/GU negative Renal ROS     Musculoskeletal   Abdominal   Peds  Hematology   Anesthesia Other Findings Past Medical History: No date: Hyperlipidemia  Reproductive/Obstetrics                              Anesthesia Physical Anesthesia Plan  ASA: 2  Anesthesia Plan: General   Post-op Pain Management: Minimal or no pain anticipated   Induction: Intravenous  PONV Risk Score and Plan: 3 and Propofol infusion, TIVA and Ondansetron  Airway Management Planned: Nasal Cannula  Additional Equipment: None  Intra-op Plan:   Post-operative Plan:   Informed Consent: I have reviewed the patients History and Physical, chart, labs and discussed the procedure including the risks, benefits and alternatives for the proposed anesthesia with the patient or authorized representative who has indicated his/her understanding and acceptance.     Dental advisory given  Plan Discussed with: CRNA and Surgeon  Anesthesia Plan Comments: (Discussed  risks of anesthesia with patient, including possibility of difficulty with spontaneous ventilation under anesthesia necessitating airway intervention, PONV, and rare risks such as cardiac or respiratory or neurological events, and allergic reactions. Discussed the role of CRNA in patient's perioperative care. Patient understands.)         Anesthesia Quick Evaluation

## 2023-02-19 NOTE — Op Note (Signed)
[facility] Gastroenterology Patient Name: Whitney Torres Procedure Date: 02/19/2023 10:53 AM MRN: [id number] Account #: [id number] Date of Birth: 06/15/1971 Admit Type: Outpatient Age: 52 Room: [facility] ENDO ROOM 1 Gender: Female Note Status: Finalized Instrument Name: Jasper Riling [id number] Procedure:             Colonoscopy Indications:           Screening for colorectal malignant neoplasm Providers:             Jonathon Bellows MD, MD Referring MD:          No Local Md, MD (Referring MD) Medicines:             Monitored Anesthesia Care Complications:         No immediate complications. Procedure:             Pre-Anesthesia Assessment:                        - Prior to the procedure, a History and Physical was                         performed, and patient medications, allergies and                         sensitivities were reviewed. The patient's tolerance                         of previous anesthesia was reviewed.                        - The risks and benefits of the procedure and the                         sedation options and risks were discussed with the                         patient. All questions were answered and informed                         consent was obtained.                        - ASA Grade Assessment: II - A patient with mild                         systemic disease.                        After obtaining informed consent, the colonoscope was                         passed under direct vision. Throughout the procedure,                         the patient's blood pressure, pulse, and oxygen                         saturations were monitored continuously. The                         Colonoscope was  introduced through the anus and                         advanced to the the cecum, identified by the                         appendiceal orifice. The colonoscopy was performed                         with ease. The patient tolerated the procedure  well.                         The quality of the bowel preparation was excellent. Findings:      The perianal and digital rectal examinations were normal.      A 5 mm polyp was found in the ascending colon. The polyp was sessile.       The polyp was removed with a cold snare. Resection and retrieval were       complete.      The exam was otherwise without abnormality on direct and retroflexion       views. Impression:            - One 5 mm polyp in the ascending colon, removed with                         a cold snare. Resected and retrieved.                        - The examination was otherwise normal on direct and                         retroflexion views. Recommendation:        - Discharge patient to home (with escort).                        - Resume previous diet.                        - Continue present medications.                        - Await pathology results.                        - Repeat colonoscopy for surveillance based on                         pathology results. Procedure Code(s):     --- Professional ---                        [phone], Colonoscopy, flexible; with removal of                         tumor(s), polyp(s), or other lesion(s) by snare                         technique Diagnosis Code(s):     --- Professional ---  Z12.11, Encounter for screening for malignant neoplasm                         of colon                        D12.2, Benign neoplasm of ascending colon CPT copyright 2022 American Medical Association. All rights reserved. The codes documented in this report are preliminary and upon coder review may  be revised to meet current compliance requirements. Jonathon Bellows, MD Jonathon Bellows MD, MD 02/19/2023 11:32:39 AM This report has been signed electronically. Number of Addenda: 0 Note Initiated On: 02/19/2023 10:53 AM Scope Withdrawal Time: 0 hours 12 minutes 0 seconds  Total Procedure Duration: 0 hours 14 minutes 18 seconds   Estimated Blood Loss:  Estimated blood loss: none.      [facility]

## 2023-02-19 NOTE — H&P (Signed)
     Jonathon Bellows, MD [address] [place], [place], [place], [address] [address], [place], [place], [place], [address] Phone: [phone]  Fax: [phone]  Primary Care Physician:  Teodora Medici, DO   Pre-Procedure History & Physical: HPI:  Whitney Torres is a 52 y.o. female is here for an endoscopy and colonoscopy    Past Medical History:  Diagnosis Date   Hyperlipidemia     Past Surgical History:  Procedure Laterality Date   [place], 2012   TUBAL LIGATION      Prior to Admission medications   Medication Sig Start Date End Date Taking? Authorizing Provider  atorvastatin (LIPITOR) 20 MG tablet Take 1 tablet (20 mg total) by mouth daily. 05/26/22   Teodora Medici, DO    Allergies as of 01/16/2023   (No Known Allergies)    Family History  Problem Relation Age of Onset   Hypertension Mother    Liver disease Father    Cancer Maternal Uncle        stomach   Diabetes Maternal Grandmother    Cancer Maternal Grandmother 60       colon cancer    Social History   Socioeconomic History   Marital status: Married    Spouse name: Not on file   Number of children: 2   Years of education: Not on file   Highest education level: Not on file  Occupational History   Not on file  Tobacco Use   Smoking status: Some Days    Packs/day: 0.25    Years: 10.00    Additional pack years: 0.00    Total pack years: 2.50    Types: Cigarettes   Smokeless tobacco: Never  Vaping Use   Vaping Use: Never used  Substance and Sexual Activity   Alcohol use: Yes    Alcohol/week: 2.0 standard drinks of alcohol    Types: 2 Cans of beer per week   Drug use: Never   Sexual activity: Yes  Other Topics Concern   Not on file  Social History Narrative   Not on file   Social Determinants of Health   Financial Resource Strain: Not on file  Food Insecurity: Not on file  Transportation Needs: Not on file  Physical Activity:  Not on file  Stress: Not on file  Social Connections: Not on file  Intimate Partner Violence: Not on file    Review of Systems: See HPI, otherwise negative ROS  Physical Exam: There were no vitals taken for this visit. General:   Alert,  pleasant and cooperative in NAD Head:  Normocephalic and atraumatic. Neck:  Supple; no masses or thyromegaly. Lungs:  Clear throughout to auscultation, normal respiratory effort.    Heart:  +S1, +S2, Regular rate and rhythm, No edema. Abdomen:  Soft, nontender and nondistended. Normal bowel sounds, without guarding, and without rebound.   Neurologic:  Alert and  oriented x4;  grossly normal neurologically.  Impression/Plan: Whitney Torres is here for an endoscopy and colonoscopy  to be performed for  evaluation of barettes esophagus and colon cancer screening     Risks, benefits, limitations, and alternatives regarding endoscopy have been reviewed with the patient.  Questions have been answered.  All parties agreeable.   Jonathon Bellows, MD  02/19/2023, 10:20 AM

## 2023-02-19 NOTE — Anesthesia Postprocedure Evaluation (Signed)
Anesthesia Post Note  Patient: Acie Fredrickson  Procedure(s) Performed: ESOPHAGOGASTRODUODENOSCOPY (EGD) WITH PROPOFOL COLONOSCOPY WITH PROPOFOL  Patient location during evaluation: Endoscopy Anesthesia Type: General Level of consciousness: awake and alert Pain management: pain level controlled Vital Signs Assessment: post-procedure vital signs reviewed and stable Respiratory status: spontaneous breathing, nonlabored ventilation, respiratory function stable and patient connected to nasal cannula oxygen Cardiovascular status: blood pressure returned to baseline and stable Postop Assessment: no apparent nausea or vomiting Anesthetic complications: no   No notable events documented.   Last Vitals:  Vitals:   02/19/23 1146 02/19/23 1156  BP:    Pulse:    Resp: 16 20  Temp:    SpO2:      Last Pain:  Vitals:   02/19/23 1156  TempSrc:   PainSc: 0-No pain                 Arita Miss

## 2023-02-19 NOTE — Transfer of Care (Signed)
Immediate Anesthesia Transfer of Care Note  Patient: Whitney Torres  Procedure(s) Performed: ESOPHAGOGASTRODUODENOSCOPY (EGD) WITH PROPOFOL COLONOSCOPY WITH PROPOFOL  Patient Location: Endoscopy Unit  Anesthesia Type:General  Level of Consciousness: awake and patient cooperative  Airway & Oxygen Therapy: Patient Spontanous Breathing and Patient connected to nasal cannula oxygen  Post-op Assessment: Report given to RN and Post -op Vital signs reviewed and stable  Post vital signs: Reviewed and stable  Last Vitals:  Vitals Value Taken Time  BP 107/71 02/19/23 1136  Temp 36.8 C 02/19/23 1136  Pulse 74 02/19/23 1137  Resp 18 02/19/23 1136  SpO2 100 % 02/19/23 1137  Vitals shown include unvalidated device data.  Last Pain:  Vitals:   02/19/23 1136  TempSrc: Temporal  PainSc: 0-No pain         Complications: No notable events documented.

## 2023-02-20 ENCOUNTER — Encounter: Payer: Self-pay | Admitting: Gastroenterology

## 2023-02-20 LAB — SURGICAL PATHOLOGY

## 2023-03-21 ENCOUNTER — Encounter: Payer: BC Managed Care – PPO | Admitting: Internal Medicine

## 2023-04-08 NOTE — Progress Notes (Unsigned)
Established Patient Office Visit  Subjective:  Patient ID: Whitney Torres, female    DOB: [birth year]  Age: 52 y.o. MRN: [id number]  CC:  No chief complaint on file.   HPI Flordia Torres presents for follow up.  Elevated Blood Pressure Reading: -Previously had been experiencing high blood pressure at work while under stress and some non-exertional chest pain. Today she states that she has been working on her stress management with exercise and creating art, which has really helped her stress levels.  -Medications: None -Patient is compliant with above medications and reports no side effects. -Denies any SOB, CP, vision changes, LE edema or symptoms of hypotension  HLD: -Medications: Lipitor 20 mg -Patient is compliant with above medications and reports no side effects. -Last lipid panel: 12/22: Lipid Panel     Component Value Date/Time   CHOL 166 10/27/2021 1420   TRIG 87 10/27/2021 1420   HDL 58 10/27/2021 1420   CHOLHDL 2.9 10/27/2021 1420   LDLCALC 90 10/27/2021 1420   GERD: -Has been complaining of acid reflux symptoms with certain foods like vinegar, Tabasco, spices. Will have reflux and mild epigastric/LUQ pain. No nausea, vomiting, no change in appetite. Weight stable. No blood in stools. - At her last office visit she was started on Protonix 40 mg.  Patient states she took this every day which controlled her symptoms, however she did not want to be dependent on medication so she stopped it.  Her acid reflux symptoms continue, however she was able to control her diet and avoid triggering foods, which has helped.  She will take apple cider vinegar at night, which is helped her symptoms as well as baking soda in the morning. -EGD 15 years ago showing gastritis per patient; cannot view these results.  Patient currently denying dark stools or blood in the stools  Hemorrhoids: -Had these since her son was delivered 11 years ago but was lifting some heavy boxes  a few weeks ago which triggered some hemorrhoidal bleeding and pain -Using over the counter Preparation H which has been helping, symptoms resolved at this point -BM regular, trying to drink more fluids.   Health Maintenance: -Blood work due  -Pap due  -Colon cancer screening due -Mammogram due  Past Medical History:  Diagnosis Date   Hyperlipidemia     Past Surgical History:  Procedure Laterality Date   APPENDECTOMY     CESAREAN SECTION     1999, 2012   COLONOSCOPY WITH PROPOFOL N/A 02/19/2023   Procedure: COLONOSCOPY WITH PROPOFOL;  Surgeon: Wyline Mood, MD;  Location: [facility] ENDOSCOPY;  Service: Gastroenterology;  Laterality: N/A;   ESOPHAGOGASTRODUODENOSCOPY (EGD) WITH PROPOFOL N/A 02/19/2023   Procedure: ESOPHAGOGASTRODUODENOSCOPY (EGD) WITH PROPOFOL;  Surgeon: Wyline Mood, MD;  Location: [facility] ENDOSCOPY;  Service: Gastroenterology;  Laterality: N/A;   TUBAL LIGATION      Family History  Problem Relation Age of Onset   Hypertension Mother    Liver disease Father    Cancer Maternal Uncle        stomach   Diabetes Maternal Grandmother    Cancer Maternal Grandmother 60       colon cancer    Social History   Socioeconomic History   Marital status: Married    Spouse name: Not on file   Number of children: 2   Years of education: Not on file   Highest education level: Not on file  Occupational History   Not on file  Tobacco Use   Smoking status: Some Days  Packs/day: 0.25    Years: 10.00    Additional pack years: 0.00    Total pack years: 2.50    Types: Cigarettes   Smokeless tobacco: Never  Vaping Use   Vaping Use: Never used  Substance and Sexual Activity   Alcohol use: Yes    Alcohol/week: 2.0 standard drinks of alcohol    Types: 2 Cans of beer per week   Drug use: Never   Sexual activity: Yes  Other Topics Concern   Not on file  Social History Narrative   Not on file   Social Determinants of Health   Financial Resource Strain: Not on file  Food  Insecurity: Not on file  Transportation Needs: Not on file  Physical Activity: Not on file  Stress: Not on file  Social Connections: Not on file  Intimate Partner Violence: Not on file    Outpatient Medications Prior to Visit  Medication Sig Dispense Refill   atorvastatin (LIPITOR) 20 MG tablet Take 1 tablet (20 mg total) by mouth daily. 90 tablet 3   No facility-administered medications prior to visit.    No Known Allergies  ROS Review of Systems  Constitutional:  Negative for chills and fatigue.  Eyes:  Negative for visual disturbance.  Respiratory:  Negative for shortness of breath.   Cardiovascular:  Negative for chest pain.  Gastrointestinal:  Negative for abdominal pain, blood in stool, nausea and vomiting.  Neurological:  Negative for headaches.      Objective:    Physical Exam Constitutional:      Appearance: Normal appearance.  HENT:     Head: Normocephalic and atraumatic.  Eyes:     Conjunctiva/sclera: Conjunctivae normal.  Cardiovascular:     Rate and Rhythm: Normal rate and regular rhythm.  Pulmonary:     Effort: Pulmonary effort is normal.     Breath sounds: Normal breath sounds.  Musculoskeletal:     Right lower leg: No edema.     Left lower leg: No edema.  Skin:    General: Skin is warm and dry.  Neurological:     General: No focal deficit present.     Mental Status: She is alert. Mental status is at baseline.  Psychiatric:        Mood and Affect: Mood normal.        Behavior: Behavior normal.     There were no vitals taken for this visit. Wt Readings from Last 3 Encounters:  02/19/23 129 lb 6.4 oz (58.7 kg)  01/16/23 131 lb 6 oz (59.6 kg)  09/11/22 135 lb 6.4 oz (61.4 kg)     Health Maintenance Due  Topic Date Due   PAP SMEAR-Modifier  01/29/2020   COVID-19 Vaccine (3 - 2023-24 season) 07/28/2022   MAMMOGRAM  Never done   Zoster Vaccines- Shingrix (1 of 2) Never done    There are no preventive care reminders to display for this  patient.  Lab Results  Component Value Date   TSH 1.92 05/26/2022   Lab Results  Component Value Date   WBC 8.4 05/26/2022   HGB 13.5 05/26/2022   HCT 39.8 05/26/2022   MCV 94.3 05/26/2022   PLT 232 05/26/2022   Lab Results  Component Value Date   NA 140 10/27/2021   K 4.4 10/27/2021   CO2 27 10/27/2021   GLUCOSE 84 10/27/2021   BUN 13 10/27/2021   CREATININE 0.64 10/27/2021   BILITOT 0.6 10/27/2021   AST 15 10/27/2021   ALT 13 10/27/2021  PROT 7.0 10/27/2021   CALCIUM 9.6 10/27/2021   Lab Results  Component Value Date   CHOL 166 10/27/2021   Lab Results  Component Value Date   HDL 58 10/27/2021   Lab Results  Component Value Date   LDLCALC 90 10/27/2021   Lab Results  Component Value Date   TRIG 87 10/27/2021   Lab Results  Component Value Date   CHOLHDL 2.9 10/27/2021   Lab Results  Component Value Date   HGBA1C 5.5 08/01/2019      Assessment & Plan:   1. Gastroesophageal reflux disease without esophagitis/Hemorrhoids, unspecified hemorrhoid type/Colon cancer screening: Acid reflux symptoms controlled now that she is trying to avoid triggering foods.  She states that apple cider vinegar and baking soda is helping her symptoms.  She is no longer on a PPI.  She does have history of gastritis on EGD from 15 years ago as well as family history of stomach cancer and colon cancer.  She will be referred to GI for both repeat EGD and colonoscopy for colon cancer screening and hemorrhoids.  - Ambulatory referral to Gastroenterology  2. Encounter for screening mammogram for malignant neoplasm of breast: Mammogram ordered today.  - MM 3D SCREEN BREAST BILATERAL; Future  3. Need for influenza vaccination: Flu vaccine administered. - Flu Vaccine QUAD 6+ mos PF IM (Fluarix Quad PF)  4. Elevated blood pressure reading: Blood pressure stable.  Continue to monitor.  Not currently on medications.  5. Mixed hyperlipidemia: Stable.  Continue Lipitor 20 mg, plan to  recheck fasting labs at follow-up in 6 months.   Follow-up: No follow-ups on file.    Margarita Mail, DO

## 2023-04-09 ENCOUNTER — Encounter: Payer: Self-pay | Admitting: Internal Medicine

## 2023-04-09 ENCOUNTER — Other Ambulatory Visit (HOSPITAL_COMMUNITY)
Admission: RE | Admit: 2023-04-09 | Discharge: 2023-04-09 | Disposition: A | Discharge status: Home / Self Care | Payer: BC Managed Care – PPO | Source: Ambulatory Visit | Attending: Internal Medicine | Admitting: Internal Medicine

## 2023-04-09 ENCOUNTER — Ambulatory Visit (INDEPENDENT_AMBULATORY_CARE_PROVIDER_SITE_OTHER): Payer: BC Managed Care – PPO | Admitting: Internal Medicine

## 2023-04-09 VITALS — BP 132/84 | HR 68 | Temp 98.0°F | Resp 16 | Ht 62.0 in | Wt 131.8 lb

## 2023-04-09 DIAGNOSIS — Z124 Encounter for screening for malignant neoplasm of cervix: Secondary | ICD-10-CM

## 2023-04-09 DIAGNOSIS — Z0000 Encounter for general adult medical examination without abnormal findings: Secondary | ICD-10-CM | POA: Diagnosis present

## 2023-04-09 DIAGNOSIS — Z1231 Encounter for screening mammogram for malignant neoplasm of breast: Secondary | ICD-10-CM | POA: Diagnosis not present

## 2023-04-09 NOTE — Progress Notes (Signed)
Name: Whitney Torres   MRN: [id number]    DOB: 1971-02-23   Date:04/09/2023       Progress Note  Subjective  Chief Complaint  Chief Complaint  Patient presents with   Annual Exam    HPI  Patient presents for annual CPE.  Diet: morning eats oatmeal and fruits trying fatty foods and carbs Exercise: not regularly but does walk twice a week   Last Eye Exam: No Last Dental Exam: Yes  Flowsheet Row Office Visit from 10/04/2020 in [facility]  AUDIT-C Score 0      Depression: Phq 9 is  negative    04/09/2023    3:43 PM 05/26/2022    8:27 AM 11/24/2021    8:39 AM 10/27/2021   12:54 PM 10/04/2020    1:30 PM  Depression screen PHQ 2/9  Decreased Interest 0 0 0 0 0  Down, Depressed, Hopeless 0 1 0 1 0  PHQ - 2 Score 0 1 0 1 0  Altered sleeping 0 0 0 0   Tired, decreased energy 0 0 0 0   Change in appetite 0 0 0 0   Feeling bad or failure about yourself  0 0 0 0   Trouble concentrating 0 0 0 0   Moving slowly or fidgety/restless 0 0 0 0   Suicidal thoughts 0 0 0 0   PHQ-9 Score 0 1 0 1   Difficult doing work/chores Not difficult at all Not difficult at all Not difficult at all     Hypertension: BP Readings from Last 3 Encounters:  04/09/23 132/84  02/19/23 107/71  01/16/23 126/82   Obesity: Wt Readings from Last 3 Encounters:  04/09/23 131 lb 12.8 oz (59.8 kg)  02/19/23 129 lb 6.4 oz (58.7 kg)  01/16/23 131 lb 6 oz (59.6 kg)   BMI Readings from Last 3 Encounters:  04/09/23 24.11 kg/m  02/19/23 23.67 kg/m  01/16/23 24.03 kg/m     Vaccines:  Tdap: 2021 Shingrix: Discussed, can get at pharmacy  Pneumonia: Discussed, will obtain at age 16  Flu: UTD COVID-19: 2021   Hep C Screening: 2022 STD testing and prevention (HIV/chl/gon/syphilis): no concerns Intimate partner violence: negative screen  Menstrual History/LMP/Abnormal Bleeding: LMP 7 days ago, periods are becoming more irregular  Discussed importance of follow up if any  post-menopausal bleeding: not applicable  Incontinence Symptoms: negative for symptoms   Breast cancer:  - Last Mammogram: Due, ordered  Osteoporosis Prevention : Discussed high calcium and vitamin D supplementation, weight bearing exercises Bone density :no   Cervical cancer screening: Due today  Skin cancer: Discussed monitoring for atypical lesions  Colorectal cancer: colonoscopy 2/24, repeat in 10 years   Lung cancer:  Low Dose CT Chest recommended if Age 19-80 years, 20 pack-year currently smoking OR have quit w/in 15years. Patient does not qualify for screen   ECG: 10/27/21  Advanced Care Planning: A voluntary discussion about advance care planning including the explanation and discussion of advance directives.  Discussed health care proxy and Living will, and the patient was unable to identify a health care proxy.  Patient does not have a living will and power of attorney of health care   Lipids: Lab Results  Component Value Date   CHOL 166 10/27/2021   CHOL 156 10/04/2020   CHOL 259 (H) 08/01/2019   Lab Results  Component Value Date   HDL 58 10/27/2021   HDL 56 10/04/2020   HDL 45 (L) 08/01/2019   Lab  Results  Component Value Date   LDLCALC 90 10/27/2021   LDLCALC 75 10/04/2020   LDLCALC 176 (H) 08/01/2019   Lab Results  Component Value Date   TRIG 87 10/27/2021   TRIG 151 (H) 10/04/2020   TRIG 222 (H) 08/01/2019   Lab Results  Component Value Date   CHOLHDL 2.9 10/27/2021   CHOLHDL 2.8 10/04/2020   CHOLHDL 5.8 (H) 08/01/2019   No results found for: "LDLDIRECT"  Glucose: Glucose, Bld  Date Value Ref Range Status  10/27/2021 84 65 - 99 mg/dL Final    Comment:    .            Fasting reference interval .   10/04/2020 77 65 - 99 mg/dL Final    Comment:    .            Fasting reference interval .   08/01/2019 78 65 - 99 mg/dL Final    Comment:    .            Fasting reference interval .     Patient Active Problem List   Diagnosis Date  Noted   Colon cancer screening 02/19/2023   Adenomatous polyp of colon 02/19/2023   History of Bell's palsy 10/04/2020   Mixed hyperlipidemia 07/02/2020   Gastroesophageal reflux disease without esophagitis 07/02/2020   Thyromegaly 01/31/2019   Current smoker 01/31/2019   ASCUS of cervix with negative high risk HPV 01/31/2019    Past Surgical History:  Procedure Laterality Date   APPENDECTOMY     CESAREAN SECTION     1999, 2012   COLONOSCOPY WITH PROPOFOL N/A 02/19/2023   Procedure: COLONOSCOPY WITH PROPOFOL;  Surgeon: Wyline Mood, MD;  Location: [facility];  Service: Gastroenterology;  Laterality: N/A;   ESOPHAGOGASTRODUODENOSCOPY (EGD) WITH PROPOFOL N/A 02/19/2023   Procedure: ESOPHAGOGASTRODUODENOSCOPY (EGD) WITH PROPOFOL;  Surgeon: Wyline Mood, MD;  Location: [facility];  Service: Gastroenterology;  Laterality: N/A;   TUBAL LIGATION      Family History  Problem Relation Age of Onset   Hypertension Mother    Liver disease Father    Cancer Maternal Uncle        stomach   Diabetes Maternal Grandmother    Cancer Maternal Grandmother 34       colon cancer    Social History   Socioeconomic History   Marital status: Married    Spouse name: Not on file   Number of children: 2   Years of education: Not on file   Highest education level: Not on file  Occupational History   Not on file  Tobacco Use   Smoking status: Some Days    Packs/day: 0.25    Years: 10.00    Additional pack years: 0.00    Total pack years: 2.50    Types: Cigarettes   Smokeless tobacco: Never  Vaping Use   Vaping Use: Never used  Substance and Sexual Activity   Alcohol use: Yes    Alcohol/week: 2.0 standard drinks of alcohol    Types: 2 Cans of beer per week   Drug use: Never   Sexual activity: Yes  Other Topics Concern   Not on file  Social History Narrative   Not on file   Social Determinants of Health   Financial Resource Strain: Not on file  Food Insecurity: Not on file   Transportation Needs: Not on file  Physical Activity: Not on file  Stress: Not on file  Social Connections: Not on file  Intimate  Partner Violence: Not on file     Current Outpatient Medications:    atorvastatin (LIPITOR) 20 MG tablet, Take 1 tablet (20 mg total) by mouth daily., Disp: 90 tablet, Rfl: 3  No Known Allergies   Review of Systems  All other systems reviewed and are negative.    Objective  Vitals:   04/09/23 1536  BP: 132/84  Pulse: 68  Resp: 16  Temp: 98 F (36.7 C)  SpO2: 98%  Weight: 131 lb 12.8 oz (59.8 kg)  Height: 5\' 2"  (1.575 m)    Body mass index is 24.11 kg/m.  Physical Exam Exam conducted with a chaperone present.  Constitutional:      Appearance: Normal appearance.  HENT:     Head: Normocephalic and atraumatic.  Eyes:     Conjunctiva/sclera: Conjunctivae normal.  Cardiovascular:     Rate and Rhythm: Normal rate and regular rhythm.  Pulmonary:     Effort: Pulmonary effort is normal.     Breath sounds: Normal breath sounds.  Chest:  Breasts:    Right: Normal.     Left: Normal.  Genitourinary:    Comments: External genitalia within normal limits.  Vaginal mucosa pink, moist, normal rugae.  Nonfriable cervix without lesions, no discharge or bleeding noted on speculum exam.    Lymphadenopathy:     Upper Body:     Right upper body: No supraclavicular, axillary or pectoral adenopathy.     Left upper body: No supraclavicular, axillary or pectoral adenopathy.  Skin:    General: Skin is warm and dry.  Neurological:     General: No focal deficit present.     Mental Status: She is alert. Mental status is at baseline.  Psychiatric:        Mood and Affect: Mood normal.        Behavior: Behavior normal.      Recent Results (from the past 2160 hour(s))  H pylori Breath Test     Status: None   Collection Time: 01/16/23  2:49 PM  Result Value Ref Range   H pylori Breath Test Negative Negative  Surgical pathology     Status: None    Collection Time: 02/19/23 11:21 AM  Result Value Ref Range   SURGICAL PATHOLOGY      SURGICAL PATHOLOGY CASE: [id number] PATIENT: Pilar Plate Surgical Pathology Report     Specimen Submitted: A. Colon polyp, ascending; cold snare  Clinical History: Gastroesophageal reflux disease without esophagitis, colon cancer screening.  Hiatal hernia    DIAGNOSIS: A. COLON POLYP, ASCENDING; COLD SNARE: - TUBULAR ADENOMA. - NEGATIVE FOR HIGH-GRADE DYSPLASIA AND MALIGNANCY.  GROSS DESCRIPTION: A. Labeled: Cold snare polyp ascending colon Received: Formalin Collection time: 11:21 AM on 02/19/2023 Placed into formalin time: 11:21 AM on 02/19/2023 Tissue fragment(s): Multiple Size: Aggregate, 0.5 x 0.5 x 0.1 cm Description: Tan to pink soft tissue fragments Entirely submitted in 1 cassette.  CM 02/19/2023  Final Diagnosis performed by Katherine Mantle, MD.   Electronically signed 02/20/2023 8:38:26AM The electronic signature indicates that the named Attending Pathologist has evaluated the specimen Technical component performed at [facility], [address], [place], [place] [address] Lab: [phone] Dir: Jolene Schimke, MD, MMM  Professional component performed at [facility], [address] [place], [place], [place] [address] Lab: [phone] Dir: Beryle Quant, MD      Fall Risk:    04/09/2023    3:43 PM 05/26/2022    8:22 AM 11/24/2021    8:34 AM 10/27/2021  12:54 PM 10/04/2020    1:30 PM  Fall Risk   Falls in the past year? 0 0 0 0 0  Number falls in past yr: 0 0 0 0 0  Injury with Fall? 0 0 0 0 0  Risk for fall due to :    No Fall Risks   Follow up    Falls prevention discussed Falls evaluation completed     Functional Status Survey: Is the patient deaf or have difficulty hearing?: No Does the patient have difficulty seeing, even when wearing glasses/contacts?: No Does the patient have difficulty concentrating, remembering, or making  decisions?: No Does the patient have difficulty walking or climbing stairs?: No Does the patient have difficulty dressing or bathing?: No Does the patient have difficulty doing errands alone such as visiting a doctor's office or shopping?: No   Assessment & Plan  1. Annual physical exam/Cervical cancer screening/Encounter for screening mammogram for malignant neoplasm of breast: Labs ordered. Pap performed. Mammogram ordered as well.  - CBC w/Diff/Platelet - COMPLETE METABOLIC PANEL WITH GFR - Lipid Profile - Cytology - PAP - MM 3D SCREENING MAMMOGRAM BILATERAL BREAST; Future   -USPSTF grade A and B recommendations reviewed with patient; age-appropriate recommendations, preventive care, screening tests, etc discussed and encouraged; healthy living encouraged; see AVS for patient education given to patient -Discussed importance of 150 minutes of physical activity weekly, eat two servings of fish weekly, eat one serving of tree nuts ( cashews, pistachios, pecans, almonds.Marland Kitchen) every other day, eat 6 servings of fruit/vegetables daily and drink plenty of water and avoid sweet beverages.   -Reviewed Health Maintenance: Yes.

## 2023-04-10 LAB — CBC WITH DIFFERENTIAL/PLATELET
Absolute Monocytes: 623 cells/uL (ref 200–950)
Basophils Absolute: 83 cells/uL (ref 0–200)
Basophils Relative: 1 %
Eosinophils Absolute: 291 cells/uL (ref 15–500)
Eosinophils Relative: 3.5 %
HCT: 40.7 % (ref 35.0–45.0)
Hemoglobin: 13.5 g/dL (ref 11.7–15.5)
Lymphs Abs: 4150 cells/uL — ABNORMAL HIGH (ref 850–3900)
MCH: 31.3 pg (ref 27.0–33.0)
MCHC: 33.2 g/dL (ref 32.0–36.0)
MCV: 94.2 fL (ref 80.0–100.0)
MPV: 10.9 fL (ref 7.5–12.5)
Monocytes Relative: 7.5 %
Neutro Abs: 3154 cells/uL (ref 1500–7800)
Neutrophils Relative %: 38 %
Platelets: 275 10*3/uL (ref 140–400)
RBC: 4.32 10*6/uL (ref 3.80–5.10)
RDW: 12.4 % (ref 11.0–15.0)
Total Lymphocyte: 50 %
WBC: 8.3 10*3/uL (ref 3.8–10.8)

## 2023-04-10 LAB — COMPLETE METABOLIC PANEL WITH GFR
AG Ratio: 1.7 (calc) (ref 1.0–2.5)
ALT: 10 U/L (ref 6–29)
AST: 14 U/L (ref 10–35)
Albumin: 4.2 g/dL (ref 3.6–5.1)
Alkaline phosphatase (APISO): 70 U/L (ref 37–153)
BUN: 13 mg/dL (ref 7–25)
CO2: 27 mmol/L (ref 20–32)
Calcium: 9.7 mg/dL (ref 8.6–10.4)
Chloride: 106 mmol/L (ref 98–110)
Creat: 0.73 mg/dL (ref 0.50–1.03)
Globulin: 2.5 g/dL (calc) (ref 1.9–3.7)
Glucose, Bld: 73 mg/dL (ref 65–99)
Potassium: 4.3 mmol/L (ref 3.5–5.3)
Sodium: 140 mmol/L (ref 135–146)
Total Bilirubin: 0.3 mg/dL (ref 0.2–1.2)
Total Protein: 6.7 g/dL (ref 6.1–8.1)
eGFR: 100 mL/min/{1.73_m2} (ref >=60)

## 2023-04-10 LAB — LIPID PANEL
Cholesterol: 245 mg/dL — ABNORMAL HIGH (ref <200)
HDL: 59 mg/dL (ref >=50)
LDL Cholesterol (Calc): 154 mg/dL (calc) — ABNORMAL HIGH
Non-HDL Cholesterol (Calc): 186 mg/dL (calc) — ABNORMAL HIGH (ref <130)
Total CHOL/HDL Ratio: 4.2 (calc) (ref <5.0)
Triglycerides: 186 mg/dL — ABNORMAL HIGH (ref <150)

## 2023-04-11 LAB — CYTOLOGY - PAP
Adequacy: ABSENT
Comment: NEGATIVE
Diagnosis: NEGATIVE
High risk HPV: NEGATIVE

## 2023-07-02 ENCOUNTER — Ambulatory Visit
Admission: RE | Admit: 2023-07-02 | Discharge: 2023-07-02 | Disposition: A | Discharge status: Home / Self Care | Payer: BC Managed Care – PPO | Source: Ambulatory Visit | Attending: Internal Medicine | Admitting: Internal Medicine

## 2023-07-02 DIAGNOSIS — Z1231 Encounter for screening mammogram for malignant neoplasm of breast: Secondary | ICD-10-CM | POA: Diagnosis present

## 2023-07-02 DIAGNOSIS — Z0000 Encounter for general adult medical examination without abnormal findings: Secondary | ICD-10-CM

## 2023-07-03 ENCOUNTER — Other Ambulatory Visit: Payer: Self-pay | Admitting: Internal Medicine

## 2023-07-03 DIAGNOSIS — R928 Other abnormal and inconclusive findings on diagnostic imaging of breast: Secondary | ICD-10-CM

## 2023-07-04 ENCOUNTER — Ambulatory Visit
Admission: RE | Admit: 2023-07-04 | Discharge: 2023-07-04 | Disposition: A | Discharge status: Home / Self Care | Payer: BC Managed Care – PPO | Source: Ambulatory Visit | Attending: Internal Medicine | Admitting: Internal Medicine

## 2023-07-04 DIAGNOSIS — R928 Other abnormal and inconclusive findings on diagnostic imaging of breast: Secondary | ICD-10-CM | POA: Diagnosis present

## 2024-03-21 ENCOUNTER — Telehealth: Admitting: Physician Assistant

## 2024-03-21 DIAGNOSIS — K644 Residual hemorrhoidal skin tags: Secondary | ICD-10-CM

## 2024-03-21 MED ORDER — HYDROCORTISONE 2.5 % EX CREA: TOPICAL_CREAM | Freq: Two times a day (BID) | CUTANEOUS | 0 refills | Status: AC

## 2024-03-21 NOTE — Progress Notes (Signed)
E-Visit for Hemorrhoid  We are sorry that you are not feeling well. We are here to help!  Hemorrhoids are swollen veins in the rectum. They can cause itching, bleeding, and pain. Hemorrhoids are very common.  In some cases, you can see or feel hemorrhoids around the outside of the rectum. In other cases, you cannot see them because they are hidden inside the rectum. Be patient - It can take months for this to improve or go away.   Hemorrhoids do not always cause symptoms. But when they do, symptoms can include: ?Itching of the skin around the anus ?Bleeding - Bleeding is usually painless. You might see bright red blood after using the toilet. ?Pain - If a blood clot forms inside a hemorrhoid, this can cause pain. It can also cause a lump that you might be able to feel.   What can I do to keep from getting more hemorrhoids? -- The most important thing you can do is to keep from getting constipated. You should have a bowel movement at least a few times a week. When you have a bowel movement, you also should not have to push too much. Plus, your bowel movements should not be too hard. Being constipated and having hard bowel movements can make hemorrhoids worse.   I have prescribed Topical Hydrocortisone ointment 2.5%.  Apply to area two times per day for 30 days  HOME CARE: Sitz Baths twice daily. Soak buttocks in 2 or 3 inches of warm water for 10 to 15 minutes. Do not add soap, bubble bath, or anything to the water. Stool softener such as Colace 100 mg twice daily AND Miralax 1 scoop daily until you have regular soft stools Over the counter Preparation H Tucks Pads Witch Hazel  Here are some steps you can take to avoid getting constipated or having hard stools:  ?Eat lots of fruits, vegetables, and other foods with fiber. Fiber helps to increase bowel movements. If you do not get enough fiber from your diet, you can take fiber supplements. These come in the form of powders, wafers, or  pills. Some examples are Metamucil, Citrucel, Benefiber and FiberCon. If you take a fiber supplement, be sure to read the label so you know how much to take. If you're not sure, ask your provider or nurse. ?Take medicines called "stool softeners" such as docusate sodium (sample brand names: Colace, Dulcolax). These medicines increase the number of bowel movements you have. They are safe to take and they can prevent problems later.  You should request a referral for a follow up evaluation with a Gastroenterologist (GI doctor) to evaluate this chronic and relapsing condition - even if it improves to see what further steps need to be taken. This is highly linked to chronic constipation and straining to have a bowel movement. It may require further treatment or surgical intervention.   GET HELP RIGHT AWAY IF: You develop severe pain You have heavy bleeding   FOLLOW UP WITH YOUR PRIMARY PROVIDER IF: If your symptoms do not improve within 10 days  MAKE SURE YOU  Understand these instructions. Will watch your condition. Will get help right away if you are not doing well or get worse.   Thank you for choosing an e-visit.  Your e-visit answers were reviewed by a board certified advanced clinical practitioner to complete your personal care plan. Depending upon the condition, your plan could have included both over the counter or prescription medications.  Please review your pharmacy choice. Make   sure the pharmacy is open so you can pick up prescription now. If there is a problem, you may contact your provider through [employer] and have the prescription routed to another pharmacy.  Your safety is important to [place]. If you have drug allergies check your prescription carefully.   For the next 24 hours you can use MyChart to ask questions about today's visit, request a non-urgent call back, or ask for a work or school excuse. You will get an email in the next two days asking about your experience.  I hope that your e-visit has been valuable and will speed your recovery.  I have spent 5 minutes in review of e-visit questionnaire, review and updating patient chart, medical decision making and response to patient.   Mar Daring, PA-C

## 2024-08-11 ENCOUNTER — Ambulatory Visit: Payer: Self-pay

## 2024-08-11 NOTE — Telephone Encounter (Signed)
 FYI Only or Action Required?: FYI only for provider.  Patient was last seen in primary care on 04/09/2023 by Bernardo Fend, DO.  Called Nurse Triage reporting Hypertension.  Symptoms began today.  Interventions attempted: Nothing.  Symptoms are: stable.  Triage Disposition: See PCP When Office is Open (Within 3 Days)  Patient/caregiver understands and will follow disposition?: Yes                             Copied from CRM [id number]. Topic: Clinical - Red Word Triage >> Aug 11, 2024  2:13 PM Precious C wrote: [facility] that prompted transfer to Nurse Triage: HIGH BLOOD PRESSURE   Patient called in due to concerns with high blood pressure. She reported a reading of 179/91, which later lowered to 167/90. Patient stated she is currently at work and not feeling well overall. Reason for Disposition  Systolic BP >= 160 OR Diastolic >= 100  Answer Assessment - Initial Assessment Questions 1. BLOOD PRESSURE: What is your blood pressure? Did you take at least two measurements 5 minutes apart?     160/90 while on phone with this RN 2. ONSET: When did you take your blood pressure?     While on phone with this RN 3. HOW: How did you take your blood pressure? (e.g., automatic home BP monitor, visiting nurse)     Automatic cuff at work 4. HISTORY: Do you have a history of high blood pressure?     Denies 5. MEDICINES: Are you taking any medicines for blood pressure? Have you missed any doses recently?     Denies 6. OTHER SYMPTOMS: Do you have any symptoms? (e.g., blurred vision, chest pain, difficulty breathing, headache, weakness)     Mild headache, denies blurred vision- but states she was seeing lights earlier today, denies chest pain, denies difficulty breathing, denies weakness    Patient stated she is going through perimenopause and her BP has been fluctuating. Patient requested first available in office. Scheduled patient with alternate  provider in office tomorrow morning. Advised patient to call back if symptoms worsen. Patient verbalized understanding.  Protocols used: Blood Pressure - High-A-AH

## 2024-08-12 ENCOUNTER — Encounter: Payer: Self-pay | Admitting: Family Medicine

## 2024-08-12 ENCOUNTER — Ambulatory Visit (INDEPENDENT_AMBULATORY_CARE_PROVIDER_SITE_OTHER): Admitting: Family Medicine

## 2024-08-12 VITALS — BP 126/78 | HR 66 | Resp 16 | Ht 62.0 in | Wt 132.0 lb

## 2024-08-12 DIAGNOSIS — F430 Acute stress reaction: Secondary | ICD-10-CM | POA: Diagnosis not present

## 2024-08-12 DIAGNOSIS — N951 Menopausal and female climacteric states: Secondary | ICD-10-CM

## 2024-08-12 DIAGNOSIS — I160 Hypertensive urgency: Secondary | ICD-10-CM

## 2024-08-12 DIAGNOSIS — E782 Mixed hyperlipidemia: Secondary | ICD-10-CM | POA: Diagnosis not present

## 2024-08-12 DIAGNOSIS — I10 Essential (primary) hypertension: Secondary | ICD-10-CM | POA: Diagnosis not present

## 2024-08-12 DIAGNOSIS — Z8669 Personal history of other diseases of the nervous system and sense organs: Secondary | ICD-10-CM

## 2024-08-12 MED ORDER — AMLODIPINE BESYLATE 2.5 MG PO TABS
2.5000 mg | ORAL_TABLET | Freq: Every day | ORAL | 2 refills | Status: AC
Start: 2024-08-12 — End: ?

## 2024-08-12 NOTE — Patient Instructions (Signed)
 Menopause and HRT (Hormone Replacement Therapy): What to Expect Menopause is the time in your life when your menstrual period stops, and your ovaries stop making certain hormones. These hormones are called estrogen and progesterone. Low levels of these hormones can affect your health and cause symptoms. Hormone replacement therapy (HRT) is a treatment that replaces the estrogen and progesterone in your body. Types of HRT  There are many types and doses of HRT. Talk with your health care provider about what form of HRT is best for you. HRT may include: Estrogen and progestin. This is more common if you have a uterus. Estrogen-only therapy. This is used if you don't have a uterus. You may be given the HRT as: Pills. Patches. Gels. Sprays. A vaginal cream. Vaginal rings. Vaginal inserts. How many hormones you need to take and how long you should take them depend on your health. You should: Start HRT at the lowest possible dose. Stop HRT as soon as you're told to. Work with your provider so you feel informed and comfortable with what happens. Tell a health care provider about: Any allergies you have. Whether you've had blood clots or are at risk for blood clots. Whether you or family members have had cancer, such as cancer of: The breasts. The ovaries. The uterus. Any surgeries you've had. Any medical problems you have. All medicines you take. These include vitamins, herbs, eye drops, and creams. Whether you're pregnant or may be pregnant. What are the benefits? HRT can help with the symptoms of menopause. The benefits depend on: What symptoms you have. How bad your symptoms are. Your overall health. Symptoms of menopause that HRT can help with include: Hot flashes and night sweats. Hot flashes are sudden feelings of heat that spread over your face and body. Your skin may turn red, like a blush. Night sweats are hot flashes that happen when you're sleeping or trying to  sleep. Bone loss. The body loses calcium faster after menopause. This can cause your bones to be weaker. They may be more likely to break. Vaginal dryness. The lining of the vagina can get thin and dry, which can cause: Pain during sex. Infection. Burning. Itching. Urinary tract infections. Not being able to control when you pee. Irritability, which means getting annoyed easily. Short-term memory problems. What are the risks? Risks depend on your health and medical history. They also depend on if you get estrogen and progestin or estrogen-only therapy. HRT may make you more likely to have: Spotting. This is when a small amount of blood leaks from your vagina when you don't expect it to. Endometrial cancer. This is cancer of the lining of the uterus. Breast cancer. Higher density of breast tissue. This can make it harder to find breast cancer on an X-ray. A stroke. Heart disease. Blood clots. Gallbladder or liver disease. You may be more at risk for problems if you have: Endometrial cancer. Liver disease. Heart disease. Breast cancer. A history of blood clots. A history of stroke. Follow these instructions at home: Take your medicines only as told. Do not smoke, vape, or use nicotine or tobacco. Go to your provider as often as told to get: Mammograms. Pelvic exams. Medical checkups. Contact a health care provider if: Your legs hurt or swell. You feel lumps or changes in your breasts or armpits. You feel pain, burning, or bleeding when you pee. You have bleeding from your vagina that's not normal. You feel dizzy or get headaches. You have pain in your belly.  Get help right away if: You have shortness of breath. You have chest pain. You have slurred speech. Any part of your arms or legs feels weak or numb. These symptoms may be an emergency. Call 911 right away. Do not wait to see if the symptoms will go away. Do not drive yourself to the hospital. This information is  not intended to replace advice given to you by your health care provider. Make sure you discuss any questions you have with your health care provider. Document Revised: 07/13/2023 Document Reviewed: 07/13/2023 Elsevier Patient Education  2024 [employer].

## 2024-08-12 NOTE — Progress Notes (Signed)
 Name: Whitney Torres   MRN: [id number]    DOB: [birth year]   Date:08/12/2024       Progress Note  Chief Complaint  Patient presents with   Hypertension    Home readings around 140's/90's, under a lot of stress currently.   Subjective:   Whitney Torres is a 53 y.o. female, presents to clinic for routine follow up on chronic conditions  BP high intermittently with work stress Yesterday 170's/90's  Stress with family and work She's concerned about Perimenopause and menopause sx  Menses in the last week 9/5-9/8, last cycle before that April, severe HA pressure everywhere common with her cycles but this was intense and worse, then she felt she got sick after that with possible covid feeilng ill. Episode of HA and visual disturbances yesterday 170's and she decided to come in to be checked.  She denies chest pain or pressure, palpitations, lower extremity edema, dyspnea on exertion, orthopnea, PND, focal weakness numbness, slurred speech or new facial droop she had brief visual disturbances with a headache yesterday when her blood pressure was high but none since no current visual disturbances no headache  Blood pressure in office is 126/78 She is not currently on blood pressure medications She can check her blood pressure at work No significant weight changes over the last couple years Wt Readings from Last 5 Encounters:  08/12/24 132 lb (59.9 kg)  04/09/23 131 lb 12.8 oz (59.8 kg)  02/19/23 129 lb 6.4 oz (58.7 kg)  01/16/23 131 lb 6 oz (59.6 kg)  09/11/22 135 lb 6.4 oz (61.4 kg)   BMI Readings from Last 5 Encounters:  08/12/24 24.14 kg/m  04/09/23 24.11 kg/m  02/19/23 23.67 kg/m  01/16/23 24.03 kg/m  09/11/22 24.76 kg/m        Current Outpatient Medications:    atorvastatin  (LIPITOR) 20 MG tablet, Take 1 tablet (20 mg total) by mouth daily. (Patient not taking: Reported on 08/12/2024), Disp: 90 tablet, Rfl: 3   hydrocortisone  2.5 % cream, Apply  topically 2 (two) times daily. (Patient not taking: Reported on 08/12/2024), Disp: 30 g, Rfl: 0  Patient Active Problem List   Diagnosis Date Noted   Colon cancer screening 02/19/2023   Adenomatous polyp of colon 02/19/2023   History of Bell's palsy 10/04/2020   Mixed hyperlipidemia 07/02/2020   Gastroesophageal reflux disease without esophagitis 07/02/2020   Thyromegaly 01/31/2019   Current smoker 01/31/2019   ASCUS of cervix with negative high risk HPV 01/31/2019    Past Surgical History:  Procedure Laterality Date   APPENDECTOMY     CESAREAN SECTION     1999, 2012   COLONOSCOPY WITH PROPOFOL  N/A 02/19/2023   Procedure: COLONOSCOPY WITH PROPOFOL ;  Surgeon: Therisa Bi, MD;  Location[facility] ENDOSCOPY;  Service: Gastroenterology;  Laterality: N/A;   ESOPHAGOGASTRODUODENOSCOPY (EGD) WITH PROPOFOL  N/A 02/19/2023   Procedure: ESOPHAGOGASTRODUODENOSCOPY (EGD) WITH PROPOFOL ;  Surgeon: Therisa Bi, MD;  Location: [facility] ENDOSCOPY;  Service: Gastroenterology;  Laterality: N/A;   TUBAL LIGATION      Family History  Problem Relation Age of Onset   Hypertension Mother    Liver disease Father    Cancer Maternal Uncle        stomach   Diabetes Maternal Grandmother    Cancer Maternal Grandmother 13       colon cancer   Breast cancer Neg Hx     Social History   Tobacco Use   Smoking status: Former    Current packs/day: 0.25    Average  packs/day: 0.3 packs/day for 10.0 years (2.5 ttl pk-yrs)    Types: Cigarettes   Smokeless tobacco: Never  Vaping Use   Vaping status: Never Used  Substance Use Topics   Alcohol use: Yes    Alcohol/week: 2.0 standard drinks of alcohol    Types: 2 Cans of beer per week   Drug use: Never     No Known Allergies  Health Maintenance  Topic Date Due   Cervical Cancer Screening (Pap smear)  04/08/2024   Influenza Vaccine  06/27/2024   COVID-19 Vaccine (3 - Pfizer risk series) 08/28/2024 (Originally 03/31/2020)   Zoster Vaccines- Shingrix (1 of 2)  11/11/2024 (Originally 09/05/1991)   Pneumococcal Vaccine: 50+ Years (1 of 2 - PCV) 08/12/2025 (Originally 09/05/1991)   Hepatitis B Vaccines 19-59 Average Risk (1 of 3 - 19+ 3-dose series) 08/12/2025 (Originally 09/05/1991)   Mammogram  07/01/2025   DTaP/Tdap/Td (2 - Td or Tdap) 07/02/2030   Colonoscopy  02/18/2033   Hepatitis C Screening  Completed   HIV Screening  Completed   HPV VACCINES  Aged Out   Meningococcal B Vaccine  Aged Out    Chart Review Today: I personally reviewed active problem list, medication list, allergies, family history, social history, health maintenance, notes from last encounter, lab results, imaging with the patient/caregiver today.  Review of Systems  Constitutional: Negative.   HENT: Negative.    Eyes: Negative.   Respiratory: Negative.    Cardiovascular: Negative.   Gastrointestinal: Negative.   Endocrine: Negative.   Genitourinary: Negative.   Musculoskeletal: Negative.   Skin: Negative.   Allergic/Immunologic: Negative.   Neurological: Negative.   Hematological: Negative.   Psychiatric/Behavioral: Negative.    All other systems reviewed and are negative.    Objective:   Vitals:   08/12/24 0822  BP: 126/78  Pulse: 66  Resp: 16  SpO2: 100%  Weight: 132 lb (59.9 kg)  Height: 5' 2 (1.575 m)    Body mass index is 24.14 kg/m.  Physical Exam Vitals and nursing note reviewed.  Constitutional:      General: She is not in acute distress.    Appearance: Normal appearance. She is well-developed. She is not ill-appearing, toxic-appearing or diaphoretic.  HENT:     Head: Normocephalic and atraumatic.     Right Ear: External ear normal.     Left Ear: External ear normal.     Nose: Nose normal.  Eyes:     General: No scleral icterus.       Right eye: No discharge.        Left eye: No discharge.     Conjunctiva/sclera: Conjunctivae normal.  Neck:     Trachea: No tracheal deviation.  Cardiovascular:     Rate and Rhythm: Normal rate and  regular rhythm.     Pulses: Normal pulses.     Heart sounds: Normal heart sounds. No murmur heard.    No friction rub. No gallop.  Pulmonary:     Effort: Pulmonary effort is normal. No respiratory distress.     Breath sounds: Normal breath sounds. No stridor. No wheezing, rhonchi or rales.  Musculoskeletal:     Right lower leg: No edema.     Left lower leg: No edema.  Skin:    General: Skin is warm and dry.     Findings: No rash.  Neurological:     Mental Status: She is alert. Mental status is at baseline.     Cranial Nerves: No dysarthria.     Motor:  No tremor or abnormal muscle tone.     Coordination: Coordination is intact. Coordination normal.     Gait: Gait normal.     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        Behavior: Behavior normal.        Results for orders placed or performed in visit on 04/09/23  Cytology - PAP   Collection Time: 04/09/23  4:00 PM  Result Value Ref Range   High risk HPV Negative    Adequacy      Satisfactory for evaluation; transformation zone component ABSENT.   Diagnosis      - Negative for intraepithelial lesion or malignancy (NILM)   Comment Normal Reference Range HPV - Negative   CBC w/Diff/Platelet   Collection Time: 04/09/23  4:10 PM  Result Value Ref Range   WBC 8.3 3.8 - 10.8 Thousand/uL   RBC 4.32 3.80 - 5.10 Million/uL   Hemoglobin 13.5 11.7 - 15.5 g/dL   HCT 59.2 64.9 - 54.9 %   MCV 94.2 80.0 - 100.0 fL   MCH 31.3 27.0 - 33.0 pg   MCHC 33.2 32.0 - 36.0 g/dL   RDW 87.5 88.9 - 84.9 %   Platelets 275 140 - 400 Thousand/uL   MPV 10.9 7.5 - 12.5 fL   Neutro Abs 3,154 1,500 - 7,800 cells/uL   Lymphs Abs 4,150 (H) 850 - 3,900 cells/uL   Absolute Monocytes 623 200 - 950 cells/uL   Eosinophils Absolute 291 15 - 500 cells/uL   Basophils Absolute 83 0 - 200 cells/uL   Neutrophils Relative % 38 %   Total Lymphocyte 50.0 %    Monocytes Relative 7.5 %   Eosinophils Relative 3.5 %   Basophils Relative 1.0 %  COMPLETE METABOLIC PANEL WITH GFR   Collection Time: 04/09/23  4:10 PM  Result Value Ref Range   Glucose, Bld 73 65 - 99 mg/dL   BUN 13 7 - 25 mg/dL   Creat 9.26 9.49 - 8.96 mg/dL   eGFR 899 > OR = 60 fO/fpw/8.26f7   BUN/Creatinine Ratio SEE NOTE: 6 - 22 (calc)   Sodium 140 135 - 146 mmol/L   Potassium 4.3 3.5 - 5.3 mmol/L   Chloride 106 98 - 110 mmol/L   CO2 27 20 - 32 mmol/L   Calcium  9.7 8.6 - 10.4 mg/dL   Total Protein 6.7 6.1 - 8.1 g/dL   Albumin 4.2 3.6 - 5.1 g/dL   Globulin 2.5 1.9 - 3.7 g/dL (calc)   AG Ratio 1.7 1.0 - 2.5 (calc)   Total Bilirubin 0.3 0.2 - 1.2 mg/dL   Alkaline phosphatase (APISO) 70 37 - 153 U/L   AST 14 10 - 35 U/L   ALT 10 6 - 29 U/L  Lipid Profile   Collection Time: 04/09/23  4:10 PM  Result Value Ref Range   Cholesterol 245 (H) <200 mg/dL   HDL 59 > OR = 50 mg/dL   Triglycerides 813 (H) <150 mg/dL   LDL Cholesterol (Calc) 154 (H) mg/dL (calc)   Total CHOL/HDL Ratio 4.2 <5.0 (calc)   Non-HDL Cholesterol (Calc) 186 (H) <130 mg/dL (calc)   ECG interpretation   Date: 08/12/24  Rate: 49  Rhythm: sinus bradycardia  QRS Axis: normal  Intervals: normal  ST/T Wave abnormalities: normal  Conduction Disutrbances: none  Narrative Interpretation: sinus bradycardia, otherwise normal ECG  Old EKG Reviewed:  No significant changes noted from 10/27/2021 ECG      Assessment & Plan:   1. Hypertension, unspecified type (Primary) BP acceptable today, came in for BP recheck due to high reading yesterday She is concerned with persistent stress causing elevated BP.   Discussed DASH, good BP techniques for readings, her choice to monitor vs start a medication - reviewed HTN emergency sx, at this time doubt end organ damage - ECG sinus brady, will get labs and CXR to further eval. At this time she is considering starting low dose amlodipine  - amLODipine  (NORVASC ) 2.5 MG tablet;  Take 1 tablet (2.5 mg total) by mouth daily.  Dispense: 30 tablet; Refill: 2 - Comprehensive metabolic panel with GFR  2. Hypertensive urgency Info given on concerning signs and sx of HTN emergency sx that warrant ER visit/calling 911 - CBC with Differential/Platelet - Comprehensive metabolic panel with GFR - Lipid panel - Microalbumin / creatinine urine ratio - DG Chest 2 View - EKG 12-Lead  3. Acute stress reaction Multiple stressors at work, in family, she expects the stressors and impact to her to continue  4. Mixed hyperlipidemia On statin, not taking regularly in the past 3 months - Comprehensive metabolic panel with GFR - Lipid panel  5. History of Bell's palsy Left facial paralysis baseline, no other new neuro sx  6. Perimenopausal She has concerns about perimenopausal sx, menopause, - cycles irregular, skipping, she suspects recent cycle precipitated HA and several of her symptoms We discussed HRT options once her risk is assessed - explained basic principles of risk assessment (ASCVD risk, no contraindications, breast CA risk, BP etc, and further discussion with her PCP if desired - I will send additional info to pt once her labs result.)    Return for 2-3 week nurse f/up with BP reading BP recheck, f/up with PCP in 3 months.   Michelene Cower, PA-C 08/12/24 8:45 AM

## 2024-08-13 ENCOUNTER — Ambulatory Visit: Payer: Self-pay | Admitting: Family Medicine

## 2024-08-13 DIAGNOSIS — E782 Mixed hyperlipidemia: Secondary | ICD-10-CM

## 2024-08-13 LAB — COMPREHENSIVE METABOLIC PANEL WITH GFR
AG Ratio: 2 (calc) (ref 1.0–2.5)
ALT: 17 U/L (ref 6–29)
AST: 18 U/L (ref 10–35)
Albumin: 4.7 g/dL (ref 3.6–5.1)
Alkaline phosphatase (APISO): 67 U/L (ref 37–153)
BUN: 14 mg/dL (ref 7–25)
CO2: 29 mmol/L (ref 20–32)
Calcium: 9.8 mg/dL (ref 8.6–10.4)
Chloride: 103 mmol/L (ref 98–110)
Creat: 0.67 mg/dL (ref 0.50–1.03)
Globulin: 2.3 g/dL (ref 1.9–3.7)
Glucose, Bld: 91 mg/dL (ref 65–99)
Potassium: 4.8 mmol/L (ref 3.5–5.3)
Sodium: 139 mmol/L (ref 135–146)
Total Bilirubin: 0.5 mg/dL (ref 0.2–1.2)
Total Protein: 7 g/dL (ref 6.1–8.1)
eGFR: 106 mL/min/1.73m2 (ref >=60)

## 2024-08-13 LAB — LIPID PANEL
Cholesterol: 302 mg/dL — ABNORMAL HIGH (ref <200)
HDL: 58 mg/dL (ref >=50)
LDL Cholesterol (Calc): 211 mg/dL — ABNORMAL HIGH
Non-HDL Cholesterol (Calc): 244 mg/dL — ABNORMAL HIGH (ref <130)
Total CHOL/HDL Ratio: 5.2 (calc) — ABNORMAL HIGH (ref <5.0)
Triglycerides: 167 mg/dL — ABNORMAL HIGH (ref <150)

## 2024-08-13 LAB — CBC WITH DIFFERENTIAL/PLATELET
Absolute Lymphocytes: 3042 {cells}/uL (ref 850–3900)
Absolute Monocytes: 663 {cells}/uL (ref 200–950)
Basophils Absolute: 47 {cells}/uL (ref 0–200)
Basophils Relative: 0.6 %
Eosinophils Absolute: 164 {cells}/uL (ref 15–500)
Eosinophils Relative: 2.1 %
HCT: 43.4 % (ref 35.0–45.0)
Hemoglobin: 14.3 g/dL (ref 11.7–15.5)
MCH: 31.3 pg (ref 27.0–33.0)
MCHC: 32.9 g/dL (ref 32.0–36.0)
MCV: 95 fL (ref 80.0–100.0)
MPV: 10.6 fL (ref 7.5–12.5)
Monocytes Relative: 8.5 %
Neutro Abs: 3884 {cells}/uL (ref 1500–7800)
Neutrophils Relative %: 49.8 %
Platelets: 311 Thousand/uL (ref 140–400)
RBC: 4.57 Million/uL (ref 3.80–5.10)
RDW: 12.6 % (ref 11.0–15.0)
Total Lymphocyte: 39 %
WBC: 7.8 Thousand/uL (ref 3.8–10.8)

## 2024-08-13 LAB — MICROALBUMIN / CREATININE URINE RATIO
Creatinine, Urine: 39 mg/dL (ref 20–275)
Microalb, Ur: <0.2 mg/dL

## 2024-08-13 MED ORDER — ATORVASTATIN CALCIUM 20 MG PO TABS: 20.0000 mg | ORAL_TABLET | Freq: Every day | ORAL | 3 refills | Status: AC

## 2024-09-05 ENCOUNTER — Ambulatory Visit

## 2024-09-05 DIAGNOSIS — I10 Essential (primary) hypertension: Secondary | ICD-10-CM

## 2024-09-05 NOTE — Progress Notes (Addendum)
 Patient is in office today for a nurse visit for Blood Pressure Check. Patient blood pressure was 124/78, Patient No chest pain, No shortness of breath, No dyspnea on exertion, No orthopnea, No paroxysmal nocturnal dyspnea, No edema, No palpitations, No syncope  Per last visit:  1. Hypertension, unspecified type (Primary) BP acceptable today, came in for BP recheck due to high reading yesterday She is concerned with persistent stress causing elevated BP.   Discussed DASH, good BP techniques for readings, her choice to monitor vs start a medication - reviewed HTN emergency sx, at this time doubt end organ damage - ECG sinus brady, will get labs and CXR to further eval. At this time she is considering starting low dose amlodipine  - amLODipine  (NORVASC ) 2.5 MG tablet; Take 1 tablet (2.5 mg total) by mouth daily.  Dispense: 30 tablet; Refill: 2 - Comprehensive metabolic panel with GFR

## 2024-11-12 ENCOUNTER — Other Ambulatory Visit: Payer: Self-pay | Admitting: Internal Medicine

## 2024-11-12 DIAGNOSIS — I10 Essential (primary) hypertension: Secondary | ICD-10-CM

## 2024-11-12 NOTE — Telephone Encounter (Unsigned)
 Copied from CRM #[id number]. Topic: Clinical - Medication Refill >> Nov 12, 2024  9:12 AM Whitney Torres wrote: Medication: amLODipine  (NORVASC ) 2.5 MG tablet  Has the patient contacted their pharmacy? No (Agent: If no, request that the patient contact the pharmacy for the refill. If patient does not wish to contact the pharmacy document the reason why and proceed with request.) (Agent: If yes, when and what did the pharmacy advise?)  This is the patient'Torres preferred pharmacy:  [id number] [place], [place] - [address] [place] [address] Phone: [phone] Fax: [phone]  Is this the correct pharmacy for this prescription? Yes If no, delete pharmacy and type the correct one.   Has the prescription been filled recently? Yes  Is the patient out of the medication? Yes  Has the patient been seen for an appointment in the last year OR does the patient have an upcoming appointment? Yes  Can we respond through MyChart? Yes  Agent: Please be advised that Rx refills may take up to 3 business days. We ask that you follow-up with your pharmacy.

## 2024-11-14 MED ORDER — AMLODIPINE BESYLATE 2.5 MG PO TABS: 2.5000 mg | ORAL_TABLET | Freq: Every day | ORAL | 0 refills | Status: AC

## 2024-11-14 NOTE — Telephone Encounter (Signed)
 Requested Prescriptions  Pending Prescriptions Disp Refills   amLODipine  (NORVASC ) 2.5 MG tablet 90 tablet 0    Sig: Take 1 tablet (2.5 mg total) by mouth daily.     Cardiovascular: Calcium  Channel Blockers 2 Failed - 11/14/2024  2:44 PM      Failed - Valid encounter within last 6 months    Recent Outpatient Visits           3 months ago Hypertension, unspecified type   [facility] Leavy Mole, PA-C              Passed - Last BP in normal range    BP Readings from Last 1 Encounters:  08/12/24 126/78         Passed - Last Heart Rate in normal range    Pulse Readings from Last 1 Encounters:  08/12/24 66

## 2024-12-01 ENCOUNTER — Ambulatory Visit: Admitting: Internal Medicine

## 2025-01-12 ENCOUNTER — Ambulatory Visit: Admitting: Internal Medicine
